# Patient Record
Sex: Male | Born: 1980 | Race: Black or African American | Hispanic: No | Marital: Married | Smoking: Never smoker
Health system: Southern US, Community
[De-identification: ages and names within clinical notes are randomized; demographics above are authoritative.]

## PROBLEM LIST (undated history)

## (undated) DIAGNOSIS — K219 Gastro-esophageal reflux disease without esophagitis: Secondary | ICD-10-CM

## (undated) DIAGNOSIS — M1990 Unspecified osteoarthritis, unspecified site: Secondary | ICD-10-CM

## (undated) DIAGNOSIS — I10 Essential (primary) hypertension: Secondary | ICD-10-CM

## (undated) HISTORY — PX: KNEE SURGERY: SHX244

---
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
## 2001-09-15 ENCOUNTER — Encounter: Payer: Self-pay | Admitting: Family Medicine

## 2001-09-15 ENCOUNTER — Ambulatory Visit (HOSPITAL_COMMUNITY): Admission: RE | Admit: 2001-09-15 | Discharge: 2001-09-15 | Payer: Self-pay | Admitting: Family Medicine

## 2002-08-21 ENCOUNTER — Emergency Department (HOSPITAL_COMMUNITY): Admission: EM | Admit: 2002-08-21 | Discharge: 2002-08-21 | Payer: Self-pay | Admitting: Emergency Medicine

## 2005-09-05 ENCOUNTER — Emergency Department (HOSPITAL_COMMUNITY): Admission: EM | Admit: 2005-09-05 | Discharge: 2005-09-05 | Payer: Self-pay | Admitting: Emergency Medicine

## 2012-08-11 ENCOUNTER — Other Ambulatory Visit (HOSPITAL_COMMUNITY): Payer: Self-pay | Admitting: Family Medicine

## 2012-08-11 ENCOUNTER — Ambulatory Visit (HOSPITAL_COMMUNITY)
Admission: RE | Admit: 2012-08-11 | Discharge: 2012-08-11 | Disposition: A | Discharge status: Home / Self Care | Payer: 59 | Source: Ambulatory Visit | Attending: Family Medicine | Admitting: Family Medicine

## 2012-08-11 DIAGNOSIS — R05 Cough: Secondary | ICD-10-CM

## 2012-08-11 DIAGNOSIS — R059 Cough, unspecified: Secondary | ICD-10-CM

## 2016-03-25 ENCOUNTER — Encounter (HOSPITAL_COMMUNITY): Payer: Self-pay | Admitting: *Deleted

## 2016-03-25 ENCOUNTER — Emergency Department (HOSPITAL_COMMUNITY): Payer: BLUE CROSS/BLUE SHIELD

## 2016-03-25 ENCOUNTER — Emergency Department (HOSPITAL_COMMUNITY)
Admission: EM | Admit: 2016-03-25 | Discharge: 2016-03-25 | Disposition: A | Discharge status: Home / Self Care | Payer: BLUE CROSS/BLUE SHIELD | Attending: Emergency Medicine | Admitting: Emergency Medicine

## 2016-03-25 DIAGNOSIS — Y929 Unspecified place or not applicable: Secondary | ICD-10-CM | POA: Insufficient documentation

## 2016-03-25 DIAGNOSIS — Y999 Unspecified external cause status: Secondary | ICD-10-CM | POA: Insufficient documentation

## 2016-03-25 DIAGNOSIS — S93402A Sprain of unspecified ligament of left ankle, initial encounter: Secondary | ICD-10-CM | POA: Diagnosis not present

## 2016-03-25 DIAGNOSIS — Y939 Activity, unspecified: Secondary | ICD-10-CM | POA: Insufficient documentation

## 2016-03-25 DIAGNOSIS — S99912A Unspecified injury of left ankle, initial encounter: Secondary | ICD-10-CM | POA: Diagnosis present

## 2016-03-25 DIAGNOSIS — W010XXA Fall on same level from slipping, tripping and stumbling without subsequent striking against object, initial encounter: Secondary | ICD-10-CM | POA: Insufficient documentation

## 2016-03-25 MED ORDER — IBUPROFEN 400 MG PO TABS
400.0000 mg | ORAL_TABLET | Freq: Once | ORAL | Status: AC
  Administered 2016-03-25: 400 mg via ORAL
  Filled 2016-03-25: qty 1

## 2016-03-25 NOTE — ED Triage Notes (Signed)
Pt with left ankle pain since he tripped over a rock today.  Pt was able to ambulate from waiting room to triage.  Denies taking anything for it
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
## 2016-03-25 NOTE — Discharge Instructions (Signed)
Get help right away if: °Your toes or foot becomes numb or blue. °You have severe pain that gets worse °

## 2017-02-15 ENCOUNTER — Emergency Department
Admission: EM | Admit: 2017-02-15 | Discharge: 2017-02-15 | Disposition: A | Discharge status: Home / Self Care | Payer: BLUE CROSS/BLUE SHIELD | Attending: Emergency Medicine | Admitting: Emergency Medicine

## 2017-02-15 ENCOUNTER — Encounter: Payer: Self-pay | Admitting: Emergency Medicine

## 2017-02-15 ENCOUNTER — Emergency Department: Payer: BLUE CROSS/BLUE SHIELD

## 2017-02-15 DIAGNOSIS — S161XXA Strain of muscle, fascia and tendon at neck level, initial encounter: Secondary | ICD-10-CM | POA: Insufficient documentation

## 2017-02-15 DIAGNOSIS — Y999 Unspecified external cause status: Secondary | ICD-10-CM | POA: Insufficient documentation

## 2017-02-15 DIAGNOSIS — R40241 Glasgow coma scale score 13-15, unspecified time: Secondary | ICD-10-CM | POA: Diagnosis not present

## 2017-02-15 DIAGNOSIS — Y92410 Unspecified street and highway as the place of occurrence of the external cause: Secondary | ICD-10-CM | POA: Insufficient documentation

## 2017-02-15 DIAGNOSIS — W2211XA Striking against or struck by driver side automobile airbag, initial encounter: Secondary | ICD-10-CM | POA: Diagnosis not present

## 2017-02-15 DIAGNOSIS — S199XXA Unspecified injury of neck, initial encounter: Secondary | ICD-10-CM | POA: Diagnosis present

## 2017-02-15 DIAGNOSIS — Y9389 Activity, other specified: Secondary | ICD-10-CM | POA: Insufficient documentation

## 2017-02-15 DIAGNOSIS — S39012A Strain of muscle, fascia and tendon of lower back, initial encounter: Secondary | ICD-10-CM | POA: Diagnosis not present

## 2017-02-15 MED ORDER — ACETAMINOPHEN 500 MG PO TABS
1000.0000 mg | ORAL_TABLET | Freq: Once | ORAL | Status: AC
  Administered 2017-02-15: 1000 mg via ORAL
  Filled 2017-02-15: qty 2

## 2017-02-15 MED ORDER — OXYCODONE HCL 5 MG PO TABS
5.0000 mg | ORAL_TABLET | Freq: Once | ORAL | Status: AC
  Administered 2017-02-15: 5 mg via ORAL
  Filled 2017-02-15: qty 1

## 2017-02-15 MED ORDER — IBUPROFEN 800 MG PO TABS: 800.0000 mg | ORAL_TABLET | Freq: Three times a day (TID) | ORAL | 0 refills | Status: DC | PRN

## 2017-02-15 MED ORDER — CYCLOBENZAPRINE HCL 10 MG PO TABS: 10.0000 mg | ORAL_TABLET | Freq: Three times a day (TID) | ORAL | 0 refills | Status: DC | PRN
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
## 2017-02-15 NOTE — ED Notes (Signed)
Pt off floor for CT and xray

## 2017-02-15 NOTE — ED Notes (Signed)
ED Provider at bedside. 
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
## 2017-03-02 ENCOUNTER — Emergency Department
Admission: EM | Admit: 2017-03-02 | Discharge: 2017-03-02 | Disposition: A | Discharge status: Home / Self Care | Payer: BLUE CROSS/BLUE SHIELD | Attending: Emergency Medicine | Admitting: Emergency Medicine

## 2017-03-02 DIAGNOSIS — Z79899 Other long term (current) drug therapy: Secondary | ICD-10-CM | POA: Diagnosis not present

## 2017-03-02 DIAGNOSIS — M545 Low back pain: Secondary | ICD-10-CM | POA: Diagnosis not present

## 2017-03-02 DIAGNOSIS — R51 Headache: Secondary | ICD-10-CM | POA: Diagnosis not present

## 2017-03-02 MED ORDER — ORPHENADRINE CITRATE ER 100 MG PO TB12: 100.0000 mg | ORAL_TABLET | Freq: Two times a day (BID) | ORAL | 1 refills | Status: AC

## 2017-03-02 MED ORDER — ORPHENADRINE CITRATE 30 MG/ML IJ SOLN
60.0000 mg | Freq: Two times a day (BID) | INTRAMUSCULAR | Status: DC
  Administered 2017-03-02: 60 mg via INTRAMUSCULAR
  Filled 2017-03-02: qty 2

## 2017-03-02 MED ORDER — KETOROLAC TROMETHAMINE 10 MG PO TABS: 10.0000 mg | ORAL_TABLET | Freq: Four times a day (QID) | ORAL | 0 refills | Status: AC | PRN

## 2017-03-02 MED ORDER — KETOROLAC TROMETHAMINE 30 MG/ML IJ SOLN
30.0000 mg | Freq: Once | INTRAMUSCULAR | Status: AC
  Administered 2017-03-02: 30 mg via INTRAMUSCULAR
  Filled 2017-03-02: qty 1
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
## 2017-04-14 ENCOUNTER — Other Ambulatory Visit (HOSPITAL_COMMUNITY): Payer: Self-pay | Admitting: Family Medicine

## 2017-04-14 DIAGNOSIS — R1084 Generalized abdominal pain: Secondary | ICD-10-CM

## 2017-04-14 DIAGNOSIS — R10827 Generalized rebound abdominal tenderness: Secondary | ICD-10-CM

## 2017-04-27 ENCOUNTER — Ambulatory Visit (HOSPITAL_COMMUNITY)
Admission: RE | Admit: 2017-04-27 | Discharge: 2017-04-27 | Disposition: A | Discharge status: Home / Self Care | Payer: BLUE CROSS/BLUE SHIELD | Source: Ambulatory Visit | Attending: Family Medicine | Admitting: Family Medicine

## 2017-04-27 DIAGNOSIS — R1084 Generalized abdominal pain: Secondary | ICD-10-CM | POA: Diagnosis present

## 2017-04-27 DIAGNOSIS — N281 Cyst of kidney, acquired: Secondary | ICD-10-CM | POA: Diagnosis not present

## 2017-04-27 DIAGNOSIS — R10827 Generalized rebound abdominal tenderness: Secondary | ICD-10-CM

## 2017-04-27 MED ORDER — IOPAMIDOL (ISOVUE-300) INJECTION 61%
100.0000 mL | Freq: Once | INTRAVENOUS | Status: AC | PRN
  Administered 2017-04-27: 100 mL via INTRAVENOUS

## 2017-05-09 ENCOUNTER — Other Ambulatory Visit: Payer: Self-pay | Admitting: Student

## 2017-05-09 DIAGNOSIS — M79601 Pain in right arm: Secondary | ICD-10-CM

## 2017-05-09 DIAGNOSIS — M79602 Pain in left arm: Principal | ICD-10-CM

## 2017-05-13 ENCOUNTER — Ambulatory Visit
Admission: RE | Admit: 2017-05-13 | Discharge: 2017-05-13 | Disposition: A | Discharge status: Home / Self Care | Payer: BLUE CROSS/BLUE SHIELD | Source: Ambulatory Visit | Attending: Student | Admitting: Student

## 2017-05-13 DIAGNOSIS — M79601 Pain in right arm: Secondary | ICD-10-CM | POA: Insufficient documentation

## 2017-05-13 DIAGNOSIS — M79602 Pain in left arm: Secondary | ICD-10-CM | POA: Diagnosis present

## 2017-05-16 ENCOUNTER — Encounter: Payer: Self-pay | Admitting: Internal Medicine

## 2017-05-25 ENCOUNTER — Other Ambulatory Visit: Payer: Self-pay | Admitting: Gastroenterology

## 2017-05-25 ENCOUNTER — Ambulatory Visit (INDEPENDENT_AMBULATORY_CARE_PROVIDER_SITE_OTHER): Payer: BLUE CROSS/BLUE SHIELD | Admitting: Gastroenterology

## 2017-05-25 ENCOUNTER — Encounter: Payer: Self-pay | Admitting: Gastroenterology

## 2017-05-25 ENCOUNTER — Telehealth: Payer: Self-pay

## 2017-05-25 ENCOUNTER — Other Ambulatory Visit: Payer: Self-pay

## 2017-05-25 DIAGNOSIS — R1084 Generalized abdominal pain: Principal | ICD-10-CM

## 2017-05-25 DIAGNOSIS — G8929 Other chronic pain: Secondary | ICD-10-CM

## 2017-05-25 MED ORDER — LINACLOTIDE 72 MCG PO CAPS
72.0000 ug | ORAL_CAPSULE | Freq: Every day | ORAL | 11 refills | Status: DC
Start: 2017-05-25 — End: 2017-05-31

## 2017-05-25 MED ORDER — LIDOCAINE VISCOUS 2 % MT SOLN: OROMUCOSAL | 5 refills | Status: DC
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
## 2017-05-25 NOTE — Assessment & Plan Note (Addendum)
ASSOCIATED WITH WORSENING CONSTIPATION AFTER TAKING QID TORSEMIDE. NO WARNING SIGNS/SYMPTOMS. CT SCAN NEGATIVE FOR ACUTE DISEASE.  FOLLOW A LOW FODMAP DIET.  HANDOUT GIVEN. TO REDUCE ABDOMINAL PAIN, USE VISCOUS LIDOCAINE 2 TSP EVERY 4 TO 6 HOURS FOR ABDOMINAL PAIN OR HEARTBURN. USE NO MORE THAN 8 DOSES A DAY. IT WILL MAKE HER MOUTH, ESOPHAGUS, AND STOMACH NUMB. TO PREVENT CONSTIPATION, ADD LINZESS 30 MINS PRIOR TO BREAKFAST. YOU SHOULD HAVE A BM 1-3 HRS AFTER THE DOSE IF NOT TAKE THE LINZESS WITH BREAKFAST. IT CAN CAUSE EXPLOSIVE DIARRHEA. IF IT CAUSES EXPLOSIVE WATERY DIARRHEA, OPEN LINZESS CAPSULE. PLACE IN 4 TSP WATER. STIR IT FOR ONE MINUTE AND TAKE 2 TSP LIQUID. COMPLETE LABS AND UPPER ENDOSCOPY W/ MAC. PT REPORTS HE IS HARD TO SEDATE. DISCUSSED PROCEDURE, BENEFITS, & RISKS: < 1% chance of medication reaction, bleeding, OR perforation. IF NO SOURCE FOR ABDOMINAL PAIN FOUND, CONSIDER GIVENS CAPSULE ENDOSCOPY.  FOLLOW UP IN 4 MOS.

## 2017-05-25 NOTE — Patient Instructions (Signed)
Called [employer] to see if pt needed PA for EGD. No PA needed. Ref# [id number].

## 2017-05-25 NOTE — Progress Notes (Signed)
cc'ed to pcp °
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
## 2017-05-25 NOTE — Progress Notes (Signed)
On recall  °

## 2017-05-25 NOTE — Telephone Encounter (Signed)
Called pt to inform of pre-op appt 05/30/17 at 2:15pm. He wants a different appt. Gave him phone number to Endo Scheduler to change his appt.

## 2017-05-25 NOTE — Patient Instructions (Signed)
FOLLOW A LOW FODMAP DIET. SEE HANDOUT.  TO REDUCE ABDOMINAL PAIN, USE VISCOUS LIDOCAINE 2 TSP EVERY 4 TO 6 HOURS FOR ABDOMINAL PAIN OR HEARTBURN. USE NO MORE THAN 8 DOSES A DAY. IT WILL MAKE HER MOUTH, ESOPHAGUS, AND STOMACH NUMB.   TO PREVENT CONSTIPATION, ADD LINZESS 30 MINS PRIOR TO BREAKFAST. YOU SHOULD HAVE A BM 1-3 HRS AFTER THE DOSE IF NOT TAKE THE LINZESS WITH BREAKFAST. IT CAN CAUSE EXPLOSIVE DIARRHEA. IF IT CAUSES EXPLOSIVE WATERY DIARRHEA, OPEN LINZESS CAPSULE. PLACE IN 4 TSP WATER. STIR IT FOR ONE MINUTE AND TAKE 2 TSP LIQUID.  COMPLETE LABS AND UPPER ENDOSCOPY.  FOLLOW UP IN 4 MOS.

## 2017-05-26 LAB — COMPREHENSIVE METABOLIC PANEL
ALBUMIN: 4.6 g/dL (ref 3.5–5.5)
ALT: 26 IU/L (ref 0–44)
AST: 18 IU/L (ref 0–40)
Albumin/Globulin Ratio: 1.5 (ref 1.2–2.2)
Alkaline Phosphatase: 56 IU/L (ref 39–117)
BUN / CREAT RATIO: 11 (ref 9–20)
BUN: 12 mg/dL (ref 6–20)
Bilirubin Total: 0.3 mg/dL (ref 0.0–1.2)
CHLORIDE: 100 mmol/L (ref 96–106)
CO2: 22 mmol/L (ref 20–29)
CREATININE: 1.14 mg/dL (ref 0.76–1.27)
Calcium: 9.7 mg/dL (ref 8.7–10.2)
GFR calc non Af Amer: 83 mL/min/{1.73_m2} (ref >59)
GFR, EST AFRICAN AMERICAN: 96 mL/min/{1.73_m2} (ref >59)
GLUCOSE: 89 mg/dL (ref 65–99)
Globulin, Total: 3 g/dL (ref 1.5–4.5)
Potassium: 4.6 mmol/L (ref 3.5–5.2)
Sodium: 141 mmol/L (ref 134–144)
TOTAL PROTEIN: 7.6 g/dL (ref 6.0–8.5)

## 2017-05-26 LAB — CBC/DIFF AMBIGUOUS DEFAULT
BASOS ABS: 0 10*3/uL (ref 0.0–0.2)
Basos: 0 %
EOS (ABSOLUTE): 0.2 10*3/uL (ref 0.0–0.4)
Eos: 2 %
HEMOGLOBIN: 14.2 g/dL (ref 13.0–17.7)
Hematocrit: 42.8 % (ref 37.5–51.0)
IMMATURE GRANS (ABS): 0 10*3/uL (ref 0.0–0.1)
IMMATURE GRANULOCYTES: 1 %
LYMPHS: 29 %
Lymphocytes Absolute: 2.4 10*3/uL (ref 0.7–3.1)
MCH: 29.3 pg (ref 26.6–33.0)
MCHC: 33.2 g/dL (ref 31.5–35.7)
MCV: 88 fL (ref 79–97)
MONOCYTES: 11 %
Monocytes Absolute: 0.9 10*3/uL (ref 0.1–0.9)
NEUTROS PCT: 57 %
Neutrophils Absolute: 4.8 10*3/uL (ref 1.4–7.0)
PLATELETS: 300 10*3/uL (ref 150–379)
RBC: 4.85 x10E6/uL (ref 4.14–5.80)
RDW: 13.7 % (ref 12.3–15.4)
WBC: 8.2 10*3/uL (ref 3.4–10.8)

## 2017-05-26 LAB — LIPASE: LIPASE: 30 U/L (ref 13–78)

## 2017-05-26 LAB — SPECIMEN STATUS REPORT

## 2017-05-26 NOTE — Progress Notes (Signed)
Pt is aware.  

## 2017-05-27 NOTE — Patient Instructions (Signed)
Almira CoasterDeshanda Maryann ConnersM Huettner  05/27/2017     @PREFPERIOPPHARMACY @   Your procedure is scheduled on  05/31/2017 .  [facility]  [facility].  Call this number if you have problems the morning of surgery:  [phone]   Remember:  Do not eat food or drink liquids after midnight.  Take these medicines the morning of surgery with A SIP OF WATER  Neurontin, lisinopril.   Do not wear jewelry, make-up or nail polish.  Do not wear lotions, powders, or perfumes, or deodorant.  Do not shave 48 hours prior to surgery.  Men may shave face and neck.  Do not bring valuables to the hospital.  [facility] any belongings or valuables.  Contacts, dentures or bridgework may not be worn into surgery.  Leave your suitcase in the car.  After surgery it may be brought to your room.  For patients admitted to the hospital, discharge time will be determined by your treatment team.  Patients discharged the day of surgery will not be allowed to drive home.   Name and phone number of your driver:   family Special instructions:  Follow the diet instructions given to you by Dr Evelina DunField's office.  Please read over the following fact sheets that you were given. Anesthesia Post-op Instructions and Care and Recovery After Surgery       Esophagogastroduodenoscopy Esophagogastroduodenoscopy (EGD) is a procedure to examine the lining of the esophagus, stomach, and first part of the small intestine (duodenum). This procedure is done to check for problems such as inflammation, bleeding, ulcers, or growths. During this procedure, a long, flexible, lighted tube with a camera attached (endoscope) is inserted down the throat. Tell a health care provider about:  Any allergies you have.  All medicines you are taking, including vitamins, herbs, eye drops, creams, and over-the-counter medicines.  Any problems you or family members have had with anesthetic medicines.  Any blood  disorders you have.  Any surgeries you have had.  Any medical conditions you have.  Whether you are pregnant or may be pregnant. What are the risks? Generally, this is a safe procedure. However, problems may occur, including:  Infection.  Bleeding.  A tear (perforation) in the esophagus, stomach, or duodenum.  Trouble breathing.  Excessive sweating.  Spasms of the larynx.  A slowed heartbeat.  Low blood pressure.  What happens before the procedure?  Follow instructions from your health care provider about eating or drinking restrictions.  Ask your health care provider about: ? Changing or stopping your regular medicines. This is especially important if you are taking diabetes medicines or blood thinners. ? Taking medicines such as aspirin and ibuprofen. These medicines can thin your blood. Do not take these medicines before your procedure if your health care provider instructs you not to.  Plan to have someone take you home after the procedure.  If you wear dentures, be ready to remove them before the procedure. What happens during the procedure?  To reduce your risk of infection, your health care team will wash or sanitize their hands.  An IV tube will be put in a vein in your hand or arm. You will get medicines and fluids through this tube.  You will be given one or more of the following: ? A medicine to help you relax (sedative). ? A medicine to numb the area (local anesthetic). This medicine may be sprayed into your  throat. It will make you feel more comfortable and keep you from gagging or coughing during the procedure. ? A medicine for pain.  A mouth guard may be placed in your mouth to protect your teeth and to keep you from biting on the endoscope.  You will be asked to lie on your left side.  The endoscope will be lowered down your throat into your esophagus, stomach, and duodenum.  Air will be put into the endoscope. This will help your health care  provider see better.  The lining of your esophagus, stomach, and duodenum will be examined.  Your health care provider may: ? Take a tissue sample so it can be looked at in a lab (biopsy). ? Remove growths. ? Remove objects (foreign bodies) that are stuck. ? Treat any bleeding with medicines or other devices that stop tissue from bleeding. ? Widen (dilate) or stretch narrowed areas of your esophagus and stomach.  The endoscope will be taken out. The procedure may vary among health care providers and hospitals. What happens after the procedure?  Your blood pressure, heart rate, breathing rate, and blood oxygen level will be monitored often until the medicines you were given have worn off.  Do not eat or drink anything until the numbing medicine has worn off and your gag reflex has returned. This information is not intended to replace advice given to you by your health care provider. Make sure you discuss any questions you have with your health care provider. Document Released: 08/20/2004 Document Revised: 09/25/2015 Document Reviewed: 03/13/2015 Elsevier Interactive Patient Education  2018 [employer]. Esophagogastroduodenoscopy, Care After Refer to this sheet in the next few weeks. These instructions provide you with information about caring for yourself after your procedure. Your health care provider may also give you more specific instructions. Your treatment has been planned according to current medical practices, but problems sometimes occur. Call your health care provider if you have any problems or questions after your procedure. What can I expect after the procedure? After the procedure, it is common to have:  A sore throat.  Nausea.  Bloating.  Dizziness.  Fatigue.  Follow these instructions at home:  Do not eat or drink anything until the numbing medicine (local anesthetic) has worn off and your gag reflex has returned. You will know that the local anesthetic has worn  off when you can swallow comfortably.  Do not drive for 24 hours if you received a medicine to help you relax (sedative).  If your health care provider took a tissue sample for testing during the procedure, make sure to get your test results. This is your responsibility. Ask your health care provider or the department performing the test when your results will be ready.  Keep all follow-up visits as told by your health care provider. This is important. Contact a health care provider if:  You cannot stop coughing.  You are not urinating.  You are urinating less than usual. Get help right away if:  You have trouble swallowing.  You cannot eat or drink.  You have throat or chest pain that gets worse.  You are dizzy or light-headed.  You faint.  You have nausea or vomiting.  You have chills.  You have a fever.  You have severe abdominal pain.  You have black, tarry, or bloody stools. This information is not intended to replace advice given to you by your health care provider. Make sure you discuss any questions you have with your health care provider.  Document Released: 04/05/2012 Document Revised: 09/25/2015 Document Reviewed: 03/13/2015 Elsevier Interactive Patient Education  2018 [place] Anesthesia is a term that refers to techniques, procedures, and medicines that help a person stay safe and comfortable during a medical procedure. Monitored anesthesia care, or sedation, is one type of anesthesia. Your anesthesia specialist may recommend sedation if you will be having a procedure that does not require you to be unconscious, such as:  Cataract surgery.  A dental procedure.  A biopsy.  A colonoscopy.  During the procedure, you may receive a medicine to help you relax (sedative). There are three levels of sedation:  Mild sedation. At this level, you may feel awake and relaxed. You will be able to follow directions.  Moderate  sedation. At this level, you will be sleepy. You may not remember the procedure.  Deep sedation. At this level, you will be asleep. You will not remember the procedure.  The more medicine you are given, the deeper your level of sedation will be. Depending on how you respond to the procedure, the anesthesia specialist may change your level of sedation or the type of anesthesia to fit your needs. An anesthesia specialist will monitor you closely during the procedure. Let your health care provider know about:  Any allergies you have.  All medicines you are taking, including vitamins, herbs, eye drops, creams, and over-the-counter medicines.  Any use of steroids (by mouth or as a cream).  Any problems you or family members have had with sedatives and anesthetic medicines.  Any blood disorders you have.  Any surgeries you have had.  Any medical conditions you have, such as sleep apnea.  Whether you are pregnant or may be pregnant.  Any use of cigarettes, alcohol, or street drugs. What are the risks? Generally, this is a safe procedure. However, problems may occur, including:  Getting too much medicine (oversedation).  Nausea.  Allergic reaction to medicines.  Trouble breathing. If this happens, a breathing tube may be used to help with breathing. It will be removed when you are awake and breathing on your own.  Heart trouble.  Lung trouble.  Before the procedure Staying hydrated Follow instructions from your health care provider about hydration, which may include:  Up to 2 hours before the procedure - you may continue to drink clear liquids, such as water, clear fruit juice, black coffee, and plain tea.  Eating and drinking restrictions Follow instructions from your health care provider about eating and drinking, which may include:  8 hours before the procedure - stop eating heavy meals or foods such as meat, fried foods, or fatty foods.  6 hours before the procedure -  stop eating light meals or foods, such as toast or cereal.  6 hours before the procedure - stop drinking milk or drinks that contain milk.  2 hours before the procedure - stop drinking clear liquids.  Medicines Ask your health care provider about:  Changing or stopping your regular medicines. This is especially important if you are taking diabetes medicines or blood thinners.  Taking medicines such as aspirin and ibuprofen. These medicines can thin your blood. Do not take these medicines before your procedure if your health care provider instructs you not to.  Tests and exams  You will have a physical exam.  You may have blood tests done to show: ? How well your kidneys and liver are working. ? How well your blood can clot.  General instructions  Plan to have  someone take you home from the hospital or clinic.  If you will be going home right after the procedure, plan to have someone with you for 24 hours.  What happens during the procedure?  Your blood pressure, heart rate, breathing, level of pain and overall condition will be monitored.  An IV tube will be inserted into one of your veins.  Your anesthesia specialist will give you medicines as needed to keep you comfortable during the procedure. This may mean changing the level of sedation.  The procedure will be performed. After the procedure  Your blood pressure, heart rate, breathing rate, and blood oxygen level will be monitored until the medicines you were given have worn off.  Do not drive for 24 hours if you received a sedative.  You may: ? Feel sleepy, clumsy, or nauseous. ? Feel forgetful about what happened after the procedure. ? Have a sore throat if you had a breathing tube during the procedure. ? Vomit. This information is not intended to replace advice given to you by your health care provider. Make sure you discuss any questions you have with your health care provider. Document Released: 01/13/2005  Document Revised: 09/26/2015 Document Reviewed: 08/10/2015 Elsevier Interactive Patient Education  2018 [place], Care After These instructions provide you with information about caring for yourself after your procedure. Your health care provider may also give you more specific instructions. Your treatment has been planned according to current medical practices, but problems sometimes occur. Call your health care provider if you have any problems or questions after your procedure. What can I expect after the procedure? After your procedure, it is common to:  Feel sleepy for several hours.  Feel clumsy and have poor balance for several hours.  Feel forgetful about what happened after the procedure.  Have poor judgment for several hours.  Feel nauseous or vomit.  Have a sore throat if you had a breathing tube during the procedure.  Follow these instructions at home: For at least 24 hours after the procedure:   Do not: ? Participate in activities in which you could fall or become injured. ? Drive. ? Use heavy machinery. ? Drink alcohol. ? Take sleeping pills or medicines that cause drowsiness. ? Make important decisions or sign legal documents. ? Take care of children on your own.  Rest. Eating and drinking  Follow the diet that is recommended by your health care provider.  If you vomit, drink water, juice, or soup when you can drink without vomiting.  Make sure you have little or no nausea before eating solid foods. General instructions  Have a responsible adult stay with you until you are awake and alert.  Take over-the-counter and prescription medicines only as told by your health care provider.  If you smoke, do not smoke without supervision.  Keep all follow-up visits as told by your health care provider. This is important. Contact a health care provider if:  You keep feeling nauseous or you keep vomiting.  You feel  light-headed.  You develop a rash.  You have a fever. Get help right away if:  You have trouble breathing. This information is not intended to replace advice given to you by your health care provider. Make sure you discuss any questions you have with your health care provider. Document Released: 08/10/2015 Document Revised: 12/10/2015 Document Reviewed: 08/10/2015 Elsevier Interactive Patient Education  [employer].

## 2017-05-30 ENCOUNTER — Encounter (HOSPITAL_COMMUNITY)
Admission: RE | Admit: 2017-05-30 | Discharge: 2017-05-30 | Disposition: A | Discharge status: Home / Self Care | Payer: BLUE CROSS/BLUE SHIELD | Source: Ambulatory Visit | Attending: Gastroenterology | Admitting: Gastroenterology

## 2017-05-30 ENCOUNTER — Encounter (HOSPITAL_COMMUNITY): Payer: Self-pay

## 2017-05-30 ENCOUNTER — Other Ambulatory Visit: Payer: Self-pay

## 2017-05-31 ENCOUNTER — Telehealth: Payer: Self-pay

## 2017-05-31 ENCOUNTER — Ambulatory Visit (HOSPITAL_COMMUNITY)
Admission: RE | Admit: 2017-05-31 | Discharge: 2017-05-31 | Disposition: A | Discharge status: Home / Self Care | Payer: BLUE CROSS/BLUE SHIELD | Source: Ambulatory Visit | Attending: Gastroenterology | Admitting: Gastroenterology

## 2017-05-31 ENCOUNTER — Ambulatory Visit (HOSPITAL_COMMUNITY): Payer: BLUE CROSS/BLUE SHIELD | Admitting: Anesthesiology

## 2017-05-31 ENCOUNTER — Encounter (HOSPITAL_COMMUNITY): Payer: Self-pay | Admitting: *Deleted

## 2017-05-31 ENCOUNTER — Encounter (HOSPITAL_COMMUNITY)
Admission: RE | Disposition: A | Discharge status: Home / Self Care | Payer: Self-pay | Source: Ambulatory Visit | Attending: Gastroenterology

## 2017-05-31 DIAGNOSIS — G8929 Other chronic pain: Secondary | ICD-10-CM

## 2017-05-31 DIAGNOSIS — R1013 Epigastric pain: Secondary | ICD-10-CM | POA: Diagnosis not present

## 2017-05-31 DIAGNOSIS — K2980 Duodenitis without bleeding: Secondary | ICD-10-CM | POA: Insufficient documentation

## 2017-05-31 DIAGNOSIS — I252 Old myocardial infarction: Secondary | ICD-10-CM | POA: Insufficient documentation

## 2017-05-31 DIAGNOSIS — R1084 Generalized abdominal pain: Secondary | ICD-10-CM

## 2017-05-31 DIAGNOSIS — K2970 Gastritis, unspecified, without bleeding: Secondary | ICD-10-CM

## 2017-05-31 DIAGNOSIS — Z79899 Other long term (current) drug therapy: Secondary | ICD-10-CM | POA: Insufficient documentation

## 2017-05-31 DIAGNOSIS — K3189 Other diseases of stomach and duodenum: Secondary | ICD-10-CM | POA: Diagnosis not present

## 2017-05-31 DIAGNOSIS — K2950 Unspecified chronic gastritis without bleeding: Secondary | ICD-10-CM | POA: Diagnosis not present

## 2017-05-31 HISTORY — PX: ESOPHAGOGASTRODUODENOSCOPY (EGD) WITH PROPOFOL: SHX5813

## 2017-05-31 SURGERY — ESOPHAGOGASTRODUODENOSCOPY (EGD) WITH PROPOFOL
Anesthesia: Monitor Anesthesia Care

## 2017-05-31 MED ORDER — PROPOFOL 10 MG/ML IV BOLUS
INTRAVENOUS | Status: AC
  Filled 2017-05-31: qty 40

## 2017-05-31 MED ORDER — LACTATED RINGERS IV SOLN
INTRAVENOUS | Status: DC
  Administered 2017-05-31: 09:00:00 via INTRAVENOUS

## 2017-05-31 MED ORDER — CHLORHEXIDINE GLUCONATE CLOTH 2 % EX PADS: 6.0000 | MEDICATED_PAD | Freq: Once | CUTANEOUS | Status: DC

## 2017-05-31 MED ORDER — LIDOCAINE VISCOUS 2 % MT SOLN
OROMUCOSAL | Status: AC
  Filled 2017-05-31: qty 15

## 2017-05-31 MED ORDER — PROPOFOL 500 MG/50ML IV EMUL
INTRAVENOUS | Status: DC | PRN
  Administered 2017-05-31: 135 ug/kg/min via INTRAVENOUS

## 2017-05-31 MED ORDER — ONDANSETRON 4 MG PO TBDP
ORAL_TABLET | ORAL | Status: AC
  Filled 2017-05-31: qty 1

## 2017-05-31 MED ORDER — MIDAZOLAM HCL 2 MG/2ML IJ SOLN
INTRAMUSCULAR | Status: AC
  Filled 2017-05-31: qty 2

## 2017-05-31 MED ORDER — ONDANSETRON 4 MG PO TBDP
4.0000 mg | ORAL_TABLET | Freq: Once | ORAL | Status: AC
  Administered 2017-05-31: 4 mg via ORAL

## 2017-05-31 MED ORDER — LIDOCAINE VISCOUS 2 % MT SOLN
5.0000 mL | Freq: Once | OROMUCOSAL | Status: AC
  Administered 2017-05-31: 5 mL via OROMUCOSAL

## 2017-05-31 MED ORDER — LUBIPROSTONE 24 MCG PO CAPS: 24.0000 ug | ORAL_CAPSULE | Freq: Two times a day (BID) | ORAL | 11 refills | Status: DC

## 2017-05-31 MED ORDER — MIDAZOLAM HCL 2 MG/2ML IJ SOLN
1.0000 mg | Freq: Once | INTRAMUSCULAR | Status: AC | PRN
  Administered 2017-05-31: 2 mg via INTRAVENOUS

## 2017-05-31 MED ORDER — MIDAZOLAM HCL 5 MG/5ML IJ SOLN
INTRAMUSCULAR | Status: DC | PRN
  Administered 2017-05-31: 2 mg via INTRAVENOUS

## 2017-05-31 NOTE — Interval H&P Note (Signed)
History and Physical Interval Note:  05/31/2017 9:20 AM  Jeffrey Thomas  has presented today for surgery, with the diagnosis of abdominal pain  The various methods of treatment have been discussed with the patient and family. After consideration of risks, benefits and other options for treatment, the patient has consented to  Procedure(s) with comments: ESOPHAGOGASTRODUODENOSCOPY (EGD) WITH PROPOFOL (N/A) - 9:45am as a surgical intervention .  The patient's history has been reviewed, patient examined, no change in status, stable for surgery.  I have reviewed the patient's chart and labs.  Questions were answered to the patient's satisfaction.     Eaton CorporationSandi Keah Lamba

## 2017-05-31 NOTE — Telephone Encounter (Signed)
REVIEWED-NO ADDITIONAL RECOMMENDATIONS. 

## 2017-05-31 NOTE — Op Note (Signed)
[facility] Patient Name: Jeffrey RidgesDeshanda Towles Procedure Date: 05/31/2017 9:09 AM MRN: [id number] Date of Birth: 09/24/1980 Attending MD: Jonette EvaSandi Rally Ouch MD, MD CSN: [id number] Age: 37 Admit Type: Outpatient Procedure:                Upper GI endoscopy WITH COLD FORCEPS BIOPSY Indications:              Dyspepsia Providers:                Jonette EvaSandi Muslima Toppins MD, MD, Loma MessingLurae B. Patsy LagerAlbert RN, RN, Edythe ClarityKelly                            Cox, Technician Referring MD:              Medicines:                Propofol per Anesthesia Complications:            No immediate complications. Estimated Blood Loss:     Estimated blood loss was minimal. Procedure:                Pre-Anesthesia Assessment:                           - Prior to the procedure, a History and Physical                            was performed, and patient medications and                            allergies were reviewed. The patient's tolerance of                            previous anesthesia was also reviewed. The risks                            and benefits of the procedure and the sedation                            options and risks were discussed with the patient.                            All questions were answered, and informed consent                            was obtained. Prior Anticoagulants: The patient has                            taken no previous anticoagulant or antiplatelet                            agents. ASA Grade Assessment: II - A patient with                            mild systemic disease. After reviewing the risks  and benefits, the patient was deemed in                            satisfactory condition to undergo the procedure.                            After obtaining informed consent, the endoscope was                            passed under direct vision. Throughout the                            procedure, the patient's blood pressure, pulse, and   oxygen saturations were monitored continuously. The                            [id number] ([id number]) scope was introduced through the                            mouth, and advanced to the second part of duodenum.                            The upper GI endoscopy was accomplished without                            difficulty. The patient tolerated the procedure                            well. Scope In: 9:44:32 AM Scope Out: 9:51:56 AM Total Procedure Duration: 0 hours 7 minutes 24 seconds  Findings:      Diffuse mildly erythematous mucosa without bleeding was found in the       gastric antrum. Biopsies were taken with a cold forceps for histology.      The examined duodenum was normal. Biopsies were taken with a cold       forceps for histology.      A low-grade of narrowing Schatzki ring (acquired) was found at the       gastroesophageal junction. Impression:               - PATENT SCHAZTKI'S RING                           - MILD GASTRITIS Biopsied.                           - NO SOURCE FOR EPIGASTRIC PAIN/DYSPEPSIA IDENTIFIED Moderate Sedation:      Per Anesthesia Care Recommendation:           - Low fat diet.                           - Continue present medications. DISCUSSED WITH                            PHARMACY: AMITIZA AND LINZESS CO-PAY >$300. USE  AMITIZA OR LINZESS FOR CONSTIPATION. USE VISCOUS                            LIDOCIANE IF NEEDED FOR EPIGASTRIC PAIN.                           - Await pathology results.                           - Return to my office in 4 months.                           - Patient has a contact number available for                            emergencies. The signs and symptoms of potential                            delayed complications were discussed with the                            patient. Return to normal activities tomorrow.                            Written discharge instructions were provided to the                             patient. Procedure Code(s):        --- Professional ---                           [phone], Esophagogastroduodenoscopy, flexible,                            transoral; with biopsy, single or multiple Diagnosis Code(s):        --- Professional ---                           K31.89, Other diseases of stomach and duodenum                           R10.13, Epigastric pain CPT copyright 2016 American Medical Association. All rights reserved. The codes documented in this report are preliminary and upon coder review may  be revised to meet current compliance requirements. Jonette Eva, MD Jonette Eva MD, MD 05/31/2017 10:03:14 AM This report has been signed electronically. Number of Addenda: 0

## 2017-05-31 NOTE — Anesthesia Postprocedure Evaluation (Signed)
Anesthesia Post Note  Patient: Jeffrey Thomas  Procedure(s) Performed: ESOPHAGOGASTRODUODENOSCOPY (EGD) WITH PROPOFOL (N/A )  Patient location during evaluation: PACU Anesthesia Type: MAC Level of consciousness: awake Pain management: pain level controlled Vital Signs Assessment: post-procedure vital signs reviewed and stable Respiratory status: spontaneous breathing, nonlabored ventilation and respiratory function stable Cardiovascular status: blood pressure returned to baseline Postop Assessment: no apparent nausea or vomiting Anesthetic complications: no     Last Vitals:  Vitals:   05/31/17 0850 05/31/17 0900  BP: 125/84 125/84  Pulse:    Resp: 19 17  Temp:    SpO2: 97% 96%    Last Pain:  Vitals:   05/31/17 0804  TempSrc: Oral                 Antonio Woodhams J

## 2017-05-31 NOTE — Discharge Instructions (Signed)
YOUR UPPER ABDOMINAL PAIN IS MOST LIKELY DUE TO MILD gastritis & CONSTIPATION DUE TO MEDICATIONS. I biopsied your stomach AND SMALL BOWEL.   DRINK WATER TO KEEP YOUR URINE LIGHT YELLOW.  WE DO NOT HAVE SAMPLES OF AMITIZA. WE DO HAVE A THE CO-PAY CARD FOR AMITIZA WHICH YOU CAN TAKE TO THE PHARMACY AND SEE HOW MUCH IT REDUCES YOUR CO-PAY. CALL NEXT WEEK TO SEE IF WE HAVE SAMPLES OF AMITIZA 24 MCG.  WE DO HAVE SAMPLES OF LINZESS 290 MCG DAILY. TAKE ONE DAILY WITH YOUR FIRST MEAL. PLEASE CALL WITH QUESTIONS OR CONCERNS.  STRICTLY FOLLOW HIGH FIBER DIET. AVOID ITEMS THAT CAUSE BLOATING & GAS.SEE INFO BELOW.  USE VISCOUS LIDOCAINE 2 TSP WHEN NEEDED FOR FLARES OF HEARTBURN, CHEST PAIN, OR UPPER ABDOMINAL PAIN  YOUR BIOPSY RESULTS WILL BE AVAILABLE IN MY CHART AFTER FEB 1 AND MY OFFICE WILL CONTACT YOU IN 10-14 DAYS WITH YOUR RESULTS.   FOLLOW UP IN 4 MOS.   UPPER ENDOSCOPY AFTER CARE Read the instructions outlined below and refer to this sheet in the next week. These discharge instructions provide you with general information on caring for yourself after you leave the hospital. While your treatment has been planned according to the most current medical practices available, unavoidable complications occasionally occur. If you have any problems or questions after discharge, call DR. Merly Hinkson, [phone].  ACTIVITY  You may resume your regular activity, but move at a slower pace for the next 24 hours.   Take frequent rest periods for the next 24 hours.   Walking will help get rid of the air and reduce the bloated feeling in your belly (abdomen).   No driving for 24 hours (because of the medicine (anesthesia) used during the test).   You may shower.   Do not sign any important legal documents or operate any machinery for 24 hours (because of the anesthesia used during the test).    NUTRITION  Drink plenty of fluids.   You may resume your normal diet as instructed by your doctor.    Begin with a light meal and progress to your normal diet. Heavy or fried foods are harder to digest and may make you feel sick to your stomach (nauseated).   Avoid alcoholic beverages for 24 hours or as instructed.    MEDICATIONS  You may resume your normal medications.   WHAT YOU CAN EXPECT TODAY  Some feelings of bloating in the abdomen.   Passage of more gas than usual.    IF YOU HAD A BIOPSY TAKEN DURING THE UPPER ENDOSCOPY:  Eat a soft diet IF YOU HAVE NAUSEA, BLOATING, ABDOMINAL PAIN, OR VOMITING.    FINDING OUT THE RESULTS OF YOUR TEST Not all test results are available during your visit. DR. Darrick Penna WILL CALL YOU WITHIN 14 DAYS OF YOUR PROCEDUE WITH YOUR RESULTS. Do not assume everything is normal if you have not heard from DR. Kassidee Narciso, CALL HER OFFICE AT [phone].  SEEK IMMEDIATE MEDICAL ATTENTION AND CALL THE OFFICE: [phone] IF:  You have more than a spotting of blood in your stool.   Your belly is swollen (abdominal distention).   You are nauseated or vomiting.   You have a temperature over 101F.   You have abdominal pain or discomfort that is severe or gets worse throughout the day.   Gastritis  Gastritis is an inflammation (the body's way of reacting to injury and/or infection) of the stomach. It is often caused by bacterial (germ) infections. It can  also be caused BY ASPIRIN, BC/GOODY POWDER'S, (IBUPROFEN) MOTRIN, OR ALEVE (NAPROXEN), chemicals (including alcohol), SPICY FOODS, and medications. This illness may be associated with generalized malaise (feeling tired, not well), UPPER ABDOMINAL STOMACH cramps, and fever. One common bacterial cause of gastritis is an organism known as H. Pylori. This can be treated with antibiotics.    High-Fiber Diet A high-fiber diet changes your normal diet to include more whole grains, legumes, fruits, and vegetables. Changes in the diet involve replacing refined carbohydrates with unrefined foods. The calorie  level of the diet is essentially unchanged. The Dietary Reference Intake (recommended amount) for adult males is 38 grams per day. For adult females, it is 25 grams per day. Pregnant and lactating women should consume 28 grams of fiber per day.Fiber is the intact part of a plant that is not broken down during digestion. Functional fiber is fiber that has been isolated from the plant to provide a beneficial effect in the body.  PURPOSE  Increase stool bulk.   Ease and regulate bowel movements.   Lower cholesterol.   REDUCE RISK OF COLON CANCER  INDICATIONS THAT YOU NEED MORE FIBER  Constipation and hemorrhoids.   Uncomplicated diverticulosis (intestine condition) and irritable bowel syndrome.   Weight management.   As a protective measure against hardening of the arteries (atherosclerosis), diabetes, and cancer.   GUIDELINES FOR INCREASING FIBER IN THE DIET  Start adding fiber to the diet slowly. A gradual increase of about 5 more grams (2 slices of whole-wheat bread, 2 servings of most fruits or vegetables, or 1 bowl of high-fiber cereal) per day is best. Too rapid an increase in fiber may result in constipation, flatulence, and bloating.   Drink enough water and fluids to keep your urine clear or pale yellow. Water, juice, or caffeine-free drinks are recommended. Not drinking enough fluid may cause constipation.   Eat a variety of high-fiber foods rather than one type of fiber.   Try to increase your intake of fiber through using high-fiber foods rather than fiber pills or supplements that contain small amounts of fiber.   The goal is to change the types of food eaten. Do not supplement your present diet with high-fiber foods, but replace foods in your present diet.   INCLUDE A VARIETY OF FIBER SOURCES  Replace refined and processed grains with whole grains, canned fruits with fresh fruits, and incorporate other fiber sources. White rice, white breads, and most bakery goods  contain little or no fiber.   Brown whole-grain rice, buckwheat oats, and many fruits and vegetables are all good sources of fiber. These include: broccoli, Brussels sprouts, cabbage, cauliflower, beets, sweet potatoes, white potatoes (skin on), carrots, tomatoes, eggplant, squash, berries, fresh fruits, and dried fruits.   Cereals appear to be the richest source of fiber. Cereal fiber is found in whole grains and bran. Bran is the fiber-rich outer coat of cereal grain, which is largely removed in refining. In whole-grain cereals, the bran remains. In breakfast cereals, the largest amount of fiber is found in those with "bran" in their names. The fiber content is sometimes indicated on the label.   You may need to include additional fruits and vegetables each day.   In baking, for 1 cup white flour, you may use the following substitutions:   1 cup whole-wheat flour minus 2 tablespoons.   1/2 cup white flour plus 1/2 cup whole-wheat flour.

## 2017-05-31 NOTE — Telephone Encounter (Signed)
Per Dr. Darrick PennaFields, Linzess 290 mcg # 8 samples at front to take one capsule daily. Also, the co pay card for Amitiza left for him.

## 2017-05-31 NOTE — Anesthesia Preprocedure Evaluation (Signed)
Anesthesia Evaluation  Patient identified by MRN, date of birth, ID band Patient awake    Airway Mallampati: I  TM Distance: >3 FB     Dental  (+) Teeth Intact   Pulmonary neg pulmonary ROS,    Pulmonary exam normal        Cardiovascular Exercise Tolerance: Poor + Past MI (no hx of heart problems per patient/ no CP/SOB)   Rhythm:Regular Rate:Normal  15-Feb-2017 09:08:12 [facility]-ED ROUTINE RECORD Sinus rhythm  Exercise limited from back pain   Neuro/Psych negative neurological ROS     GI/Hepatic negative GI ROS, Neg liver ROS,   Endo/Other  negative endocrine ROS  Renal/GU negative Renal ROS     Musculoskeletal   Abdominal Normal abdominal exam  (+)   Peds  Hematology negative hematology ROS (+)   Anesthesia Other Findings   Reproductive/Obstetrics                             Anesthesia Physical Anesthesia Plan  ASA: II  Anesthesia Plan: MAC   Post-op Pain Management:    Induction:   PONV Risk Score and Plan:   Airway Management Planned: Nasal Cannula  Additional Equipment:   Intra-op Plan:   Post-operative Plan:   Informed Consent: I have reviewed the patients History and Physical, chart, labs and discussed the procedure including the risks, benefits and alternatives for the proposed anesthesia with the patient or authorized representative who has indicated his/her understanding and acceptance.   Dental advisory given  Plan Discussed with: CRNA  Anesthesia Plan Comments:         Anesthesia Quick Evaluation

## 2017-05-31 NOTE — Transfer of Care (Signed)
Immediate Anesthesia Transfer of Care Note  Patient: Jeffrey Thomas  Procedure(s) Performed: ESOPHAGOGASTRODUODENOSCOPY (EGD) WITH PROPOFOL (N/A )  Patient Location: PACU  Anesthesia Type:MAC  Level of Consciousness: awake and patient cooperative  Airway & Oxygen Therapy: Patient Spontanous Breathing and Patient connected to face mask oxygen  Post-op Assessment: Report given to RN, Post -op Vital signs reviewed and stable and Patient moving all extremities  Post vital signs: Reviewed and stable  Last Vitals:  Vitals:   05/31/17 0850 05/31/17 0900  BP: 125/84 125/84  Pulse:    Resp: 19 17  Temp:    SpO2: 97% 96%    Last Pain:  Vitals:   05/31/17 0804  TempSrc: Oral         Complications: No apparent anesthesia complications

## 2017-06-01 ENCOUNTER — Encounter (HOSPITAL_COMMUNITY): Payer: Self-pay | Admitting: Gastroenterology

## 2017-06-02 NOTE — Progress Notes (Signed)
Pt is aware.  

## 2017-06-14 ENCOUNTER — Other Ambulatory Visit: Payer: Self-pay | Admitting: Student

## 2017-06-14 DIAGNOSIS — M545 Low back pain: Secondary | ICD-10-CM

## 2017-06-23 ENCOUNTER — Ambulatory Visit
Admission: RE | Admit: 2017-06-23 | Discharge: 2017-06-23 | Disposition: A | Discharge status: Home / Self Care | Payer: BLUE CROSS/BLUE SHIELD | Source: Ambulatory Visit | Attending: Student | Admitting: Student

## 2017-06-23 DIAGNOSIS — M545 Low back pain: Secondary | ICD-10-CM

## 2017-06-23 DIAGNOSIS — M5137 Other intervertebral disc degeneration, lumbosacral region: Secondary | ICD-10-CM | POA: Insufficient documentation

## 2017-06-23 DIAGNOSIS — M5127 Other intervertebral disc displacement, lumbosacral region: Secondary | ICD-10-CM | POA: Diagnosis not present

## 2017-06-30 ENCOUNTER — Ambulatory Visit: Payer: BLUE CROSS/BLUE SHIELD | Admitting: Nurse Practitioner

## 2017-07-01 ENCOUNTER — Other Ambulatory Visit: Payer: Self-pay | Admitting: Physical Medicine and Rehabilitation

## 2017-07-01 DIAGNOSIS — M5416 Radiculopathy, lumbar region: Secondary | ICD-10-CM

## 2017-07-08 ENCOUNTER — Other Ambulatory Visit: Payer: Self-pay | Admitting: Physical Medicine and Rehabilitation

## 2017-07-08 ENCOUNTER — Ambulatory Visit
Admission: RE | Admit: 2017-07-08 | Discharge: 2017-07-08 | Disposition: A | Discharge status: Home / Self Care | Payer: BLUE CROSS/BLUE SHIELD | Source: Ambulatory Visit | Attending: Physical Medicine and Rehabilitation | Admitting: Physical Medicine and Rehabilitation

## 2017-07-08 ENCOUNTER — Telehealth: Payer: Self-pay

## 2017-07-08 DIAGNOSIS — M5416 Radiculopathy, lumbar region: Secondary | ICD-10-CM

## 2017-07-08 MED ORDER — IOPAMIDOL (ISOVUE-M 200) INJECTION 41%
1.0000 mL | Freq: Once | INTRAMUSCULAR | Status: AC
  Administered 2017-07-08: 1 mL via EPIDURAL

## 2017-07-08 MED ORDER — METHYLPREDNISOLONE ACETATE 40 MG/ML INJ SUSP (RADIOLOG
120.0000 mg | Freq: Once | INTRAMUSCULAR | Status: AC
  Administered 2017-07-08: 120 mg via EPIDURAL

## 2017-07-08 NOTE — Discharge Instructions (Signed)

## 2017-07-08 NOTE — Telephone Encounter (Signed)
Pts spouse walked in office for Amitiza samples. Samples were given to spouse.

## 2017-07-11 NOTE — Progress Notes (Signed)
Phone call from patient, concern for numbness in lower extremities persistent from LUMB NRB on Friday. Informed Dr Mosetta PuttGrady. Per Dr Mosetta PuttGrady, pt to follow up with referring neurologist if symptoms persist or worsen, or go to ER for emergent neurological symptoms. Pt made aware of Dr Tobi BastosGrady's recommendations.

## 2017-07-29 ENCOUNTER — Encounter
Admission: RE | Admit: 2017-07-29 | Discharge: 2017-07-29 | Disposition: A | Discharge status: Home / Self Care | Payer: BLUE CROSS/BLUE SHIELD | Source: Ambulatory Visit | Attending: Neurosurgery | Admitting: Neurosurgery

## 2017-07-29 ENCOUNTER — Other Ambulatory Visit: Payer: Self-pay

## 2017-07-29 ENCOUNTER — Ambulatory Visit
Admission: RE | Admit: 2017-07-29 | Discharge: 2017-07-29 | Disposition: A | Discharge status: Home / Self Care | Payer: BLUE CROSS/BLUE SHIELD | Source: Ambulatory Visit | Attending: Neurosurgery | Admitting: Neurosurgery

## 2017-07-29 DIAGNOSIS — Z01818 Encounter for other preprocedural examination: Secondary | ICD-10-CM | POA: Diagnosis present

## 2017-07-29 DIAGNOSIS — G5601 Carpal tunnel syndrome, right upper limb: Secondary | ICD-10-CM | POA: Diagnosis not present

## 2017-07-29 DIAGNOSIS — Z419 Encounter for procedure for purposes other than remedying health state, unspecified: Secondary | ICD-10-CM

## 2017-07-29 DIAGNOSIS — Z01812 Encounter for preprocedural laboratory examination: Secondary | ICD-10-CM | POA: Diagnosis not present

## 2017-07-29 HISTORY — DX: Unspecified osteoarthritis, unspecified site: M19.90

## 2017-07-29 HISTORY — DX: Gastro-esophageal reflux disease without esophagitis: K21.9

## 2017-07-29 HISTORY — DX: Essential (primary) hypertension: I10

## 2017-07-29 LAB — URINALYSIS, ROUTINE W REFLEX MICROSCOPIC
Bacteria, UA: NONE SEEN
Bilirubin Urine: NEGATIVE
Glucose, UA: NEGATIVE mg/dL
Ketones, ur: NEGATIVE mg/dL
Leukocytes, UA: NEGATIVE
Nitrite: NEGATIVE
Protein, ur: NEGATIVE mg/dL
Specific Gravity, Urine: 1.02 (ref 1.005–1.030)
Squamous Epithelial / HPF: NONE SEEN
pH: 5 (ref 5.0–8.0)

## 2017-07-29 LAB — DIFFERENTIAL
BASOS ABS: 0 10*3/uL (ref 0–0.1)
BASOS PCT: 0 %
EOS ABS: 0.1 10*3/uL (ref 0–0.7)
EOS PCT: 1 %
LYMPHS ABS: 2.3 10*3/uL (ref 1.0–3.6)
Lymphocytes Relative: 27 %
Monocytes Absolute: 0.9 10*3/uL (ref 0.2–1.0)
Monocytes Relative: 11 %
NEUTROS PCT: 61 %
Neutro Abs: 5.4 10*3/uL (ref 1.4–6.5)

## 2017-07-29 LAB — PROTIME-INR
INR: 0.97
Prothrombin Time: 12.8 s (ref 11.4–15.2)

## 2017-07-29 LAB — BASIC METABOLIC PANEL WITH GFR
Anion gap: 9 (ref 5–15)
BUN: 12 mg/dL (ref 6–20)
CO2: 25 mmol/L (ref 22–32)
Calcium: 9.3 mg/dL (ref 8.9–10.3)
Chloride: 103 mmol/L (ref 101–111)
Creatinine, Ser: 1.07 mg/dL (ref 0.61–1.24)
GFR calc Af Amer: >60 mL/min
GFR calc non Af Amer: >60 mL/min
Glucose, Bld: 100 mg/dL — ABNORMAL HIGH (ref 65–99)
Potassium: 3.8 mmol/L (ref 3.5–5.1)
Sodium: 137 mmol/L (ref 135–145)

## 2017-07-29 LAB — CBC
HCT: 42.7 % (ref 40.0–52.0)
Hemoglobin: 14.2 g/dL (ref 13.0–18.0)
MCH: 29.6 pg (ref 26.0–34.0)
MCHC: 33.2 g/dL (ref 32.0–36.0)
MCV: 89.1 fL (ref 80.0–100.0)
PLATELETS: 363 10*3/uL (ref 150–440)
RBC: 4.79 MIL/uL (ref 4.40–5.90)
RDW: 13.1 % (ref 11.5–14.5)
WBC: 8.8 10*3/uL (ref 3.8–10.6)

## 2017-07-29 LAB — APTT: APTT: 29 s (ref 24–36)

## 2017-07-29 NOTE — Patient Instructions (Signed)
Your procedure is scheduled on:Mon 08/08/17 Report to Day Surgery. To find out your arrival time please call [phone] between 1PM - 3PM on Friday. 08/05/17.  Remember: Instructions that are not followed completely may result in serious medical risk, up to and including death, or upon the discretion of your surgeon and anesthesiologist your surgery may need to be rescheduled.     _X__ 1. Do not eat food after midnight the night before your procedure.                 No gum chewing or hard candies. You may drink clear liquids up to 2 hours                 before you are scheduled to arrive for your surgery- DO not drink clear                 liquids within 2 hours of the start of your surgery.                 Clear Liquids include:  water, apple juice without pulp, clear carbohydrate                 drink such as Clearfast of Gartorade, Black Coffee or Tea (Do not add                 anything to coffee or tea).  __X__2.  On the morning of surgery brush your teeth with toothpaste and water, you  may rinse your mouth with mouthwash if you wish.  Do not swallow any              toothpaste of mouthwash.     ___ 3.  No Alcohol for 24 hours before or after surgery.   ___ 4.  Do Not Smoke or use e-cigarettes For 24 Hours Prior to Your Surgery.                 Do not use any chewable tobacco products for at least 6 hours prior to                 surgery.  ____  5.  Bring all medications with you on the day of surgery if instructed.   ___x_  6.  Notify your doctor if there is any change in your medical condition      (cold, fever, infections).     Do not wear jewelry, make-up, hairpins, clips or nail polish. Do not wear lotions, powders, or perfumes. You may wear deodorant. Do not shave 48 hours prior to surgery. Men may shave face and neck. Do not bring valuables to the hospital.    [facility] is not responsible for any belongings or valuables.  Contacts, dentures or  bridgework may not be worn into surgery. Leave your suitcase in the car. After surgery it may be brought to your room. For patients admitted to the hospital, discharge time is determined by your treatment team.   Patients discharged the day of surgery will not be allowed to drive home.   Please read over the following fact sheets that you were given:    _x___ Take these medicines the morning of surgery with A SIP OF WATER:    1.benzonatate (TESSALON) 100 MG capsule if needed   2. gabapentin (NEURONTIN) 100 MG capsule  3. sucralfate (CARAFATE) 1 g tablet  4.  5.  6.  ____ Fleet Enema (as directed)   _x___ Use CHG Soap  as directed  ____ Use inhalers on the day of surgery  ____ Stop metformin 2 days prior to surgery    ____ Take 1/2 of usual insulin dose the night before surgery. No insulin the morning          of surgery.   ____ Stop Coumadin/Plavix/aspirin on   _x___ Stop Anti-inflammatories on May take tylenol for pain but no Ibuprofen Aleve or BC powder   __x__ Stop supplements until after surgery. Mon 4/1Biotin w/ Vitamins C & E (HAIR/SKIN/NAILS PO)   ____ Bring C-Pap to the hospital.

## 2017-08-08 ENCOUNTER — Other Ambulatory Visit: Payer: Self-pay

## 2017-08-08 ENCOUNTER — Ambulatory Visit: Payer: BLUE CROSS/BLUE SHIELD | Admitting: Certified Registered Nurse Anesthetist

## 2017-08-08 ENCOUNTER — Ambulatory Visit
Admission: RE | Admit: 2017-08-08 | Discharge: 2017-08-08 | Disposition: A | Discharge status: Home / Self Care | Payer: BLUE CROSS/BLUE SHIELD | Source: Ambulatory Visit | Attending: Neurosurgery | Admitting: Neurosurgery

## 2017-08-08 ENCOUNTER — Encounter
Admission: RE | Disposition: A | Discharge status: Home / Self Care | Payer: Self-pay | Source: Ambulatory Visit | Attending: Neurosurgery

## 2017-08-08 ENCOUNTER — Encounter: Payer: Self-pay | Admitting: *Deleted

## 2017-08-08 DIAGNOSIS — I10 Essential (primary) hypertension: Secondary | ICD-10-CM | POA: Insufficient documentation

## 2017-08-08 DIAGNOSIS — M1990 Unspecified osteoarthritis, unspecified site: Secondary | ICD-10-CM | POA: Insufficient documentation

## 2017-08-08 DIAGNOSIS — K219 Gastro-esophageal reflux disease without esophagitis: Secondary | ICD-10-CM | POA: Insufficient documentation

## 2017-08-08 DIAGNOSIS — Z79899 Other long term (current) drug therapy: Secondary | ICD-10-CM | POA: Insufficient documentation

## 2017-08-08 DIAGNOSIS — G5602 Carpal tunnel syndrome, left upper limb: Secondary | ICD-10-CM | POA: Insufficient documentation

## 2017-08-08 HISTORY — PX: CARPAL TUNNEL RELEASE: SHX101

## 2017-08-08 SURGERY — CARPAL TUNNEL RELEASE
Anesthesia: General | Site: Wrist | Laterality: Left | Wound class: Clean

## 2017-08-08 MED ORDER — DEXAMETHASONE SODIUM PHOSPHATE 10 MG/ML IJ SOLN
INTRAMUSCULAR | Status: DC | PRN
  Administered 2017-08-08: 10 mg via INTRAVENOUS

## 2017-08-08 MED ORDER — LACTATED RINGERS IV SOLN
INTRAVENOUS | Status: DC | PRN
  Administered 2017-08-08: 07:00:00 via INTRAVENOUS

## 2017-08-08 MED ORDER — FENTANYL CITRATE (PF) 100 MCG/2ML IJ SOLN
INTRAMUSCULAR | Status: AC
  Filled 2017-08-08: qty 2

## 2017-08-08 MED ORDER — TRAMADOL HCL 50 MG PO TABS: 50.0000 mg | ORAL_TABLET | Freq: Four times a day (QID) | ORAL | 0 refills | Status: DC | PRN

## 2017-08-08 MED ORDER — SODIUM CHLORIDE FLUSH 0.9 % IV SOLN
INTRAVENOUS | Status: AC
  Filled 2017-08-08: qty 10

## 2017-08-08 MED ORDER — LIDOCAINE HCL (PF) 1 % IJ SOLN
INTRAMUSCULAR | Status: AC
  Filled 2017-08-08: qty 30

## 2017-08-08 MED ORDER — LIDOCAINE HCL 1 % IJ SOLN
INTRAMUSCULAR | Status: DC | PRN
  Administered 2017-08-08: 7 mL

## 2017-08-08 MED ORDER — PHENYLEPHRINE HCL 10 MG/ML IJ SOLN
INTRAMUSCULAR | Status: DC | PRN
  Administered 2017-08-08: 100 ug via INTRAVENOUS

## 2017-08-08 MED ORDER — LIDOCAINE HCL (CARDIAC) 20 MG/ML IV SOLN
INTRAVENOUS | Status: DC | PRN
  Administered 2017-08-08: 50 mg via INTRAVENOUS

## 2017-08-08 MED ORDER — MIDAZOLAM HCL 2 MG/2ML IJ SOLN
INTRAMUSCULAR | Status: DC | PRN
  Administered 2017-08-08: 2 mg via INTRAVENOUS

## 2017-08-08 MED ORDER — PROPOFOL 500 MG/50ML IV EMUL
INTRAVENOUS | Status: AC
  Filled 2017-08-08: qty 50

## 2017-08-08 MED ORDER — PROPOFOL 10 MG/ML IV BOLUS
INTRAVENOUS | Status: DC | PRN
  Administered 2017-08-08: 150 mg via INTRAVENOUS

## 2017-08-08 MED ORDER — TRAMADOL HCL 50 MG PO TABS
50.0000 mg | ORAL_TABLET | Freq: Four times a day (QID) | ORAL | Status: DC
  Administered 2017-08-08: 50 mg via ORAL

## 2017-08-08 MED ORDER — ONDANSETRON HCL 4 MG/2ML IJ SOLN
INTRAMUSCULAR | Status: DC | PRN
  Administered 2017-08-08: 4 mg via INTRAVENOUS

## 2017-08-08 MED ORDER — MIDAZOLAM HCL 2 MG/2ML IJ SOLN
INTRAMUSCULAR | Status: AC
  Filled 2017-08-08: qty 2

## 2017-08-08 MED ORDER — ONDANSETRON HCL 4 MG/2ML IJ SOLN: 4.0000 mg | Freq: Once | INTRAMUSCULAR | Status: DC | PRN

## 2017-08-08 MED ORDER — FENTANYL CITRATE (PF) 100 MCG/2ML IJ SOLN
INTRAMUSCULAR | Status: DC | PRN
  Administered 2017-08-08: 25 ug via INTRAVENOUS

## 2017-08-08 MED ORDER — PROPOFOL 10 MG/ML IV BOLUS
INTRAVENOUS | Status: AC
  Filled 2017-08-08: qty 20

## 2017-08-08 MED ORDER — FENTANYL CITRATE (PF) 100 MCG/2ML IJ SOLN
25.0000 ug | INTRAMUSCULAR | Status: DC | PRN
  Administered 2017-08-08 (×4): 25 ug via INTRAVENOUS

## 2017-08-08 MED ORDER — LACTATED RINGERS IV SOLN
INTRAVENOUS | Status: DC
  Administered 2017-08-08: 07:00:00 via INTRAVENOUS

## 2017-08-08 MED ORDER — FENTANYL CITRATE (PF) 100 MCG/2ML IJ SOLN
INTRAMUSCULAR | Status: AC
  Administered 2017-08-08: 25 ug via INTRAVENOUS
  Filled 2017-08-08: qty 2

## 2017-08-08 MED ORDER — DEXTROSE 5 % IV SOLN: 2.0000 g | INTRAVENOUS | Status: DC

## 2017-08-08 MED ORDER — CEFAZOLIN SODIUM-DEXTROSE 2-4 GM/100ML-% IV SOLN
2.0000 g | INTRAVENOUS | Status: AC
  Administered 2017-08-08: 2 g via INTRAVENOUS
  Filled 2017-08-08: qty 100

## 2017-08-08 MED ORDER — TRAMADOL HCL 50 MG PO TABS
ORAL_TABLET | ORAL | Status: AC
  Administered 2017-08-08: 50 mg via ORAL
  Filled 2017-08-08: qty 1

## 2017-08-08 SURGICAL SUPPLY — 32 items
ADH SKN CLS APL DERMABOND .7 (GAUZE/BANDAGES/DRESSINGS) ×1
BNDG GAUZE 4.5X4.1 6PLY STRL (MISCELLANEOUS) ×2 IMPLANT
CANISTER SUCT 1200ML W/VALVE (MISCELLANEOUS) ×2 IMPLANT
CHLORAPREP W/TINT 26ML (MISCELLANEOUS) ×4 IMPLANT
CORD BIP STRL DISP 12FT (MISCELLANEOUS) ×2 IMPLANT
DERMABOND ADVANCED (GAUZE/BANDAGES/DRESSINGS) ×1
DERMABOND ADVANCED .7 DNX12 (GAUZE/BANDAGES/DRESSINGS) ×1 IMPLANT
ELECT CAUTERY BLADE TIP 2.5 (TIP) ×2
ELECTRODE CAUTERY BLDE TIP 2.5 (TIP) ×1 IMPLANT
FORCEPS JEWEL BIP 4-3/4 STR (INSTRUMENTS) ×2 IMPLANT
GAUZE PETRO XEROFOAM 1X8 (MISCELLANEOUS) ×2 IMPLANT
GAUZE SPONGE 4X4 12PLY STRL (GAUZE/BANDAGES/DRESSINGS) ×4 IMPLANT
GLOVE BIOGEL PI IND STRL 8 (GLOVE) ×1 IMPLANT
GLOVE BIOGEL PI INDICATOR 8 (GLOVE) ×1
GLOVE SURG SYN 8.0 (GLOVE) ×2 IMPLANT
GLOVE SURG SYN 8.0 PF PI (GLOVE) ×1 IMPLANT
GOWN STRL REUS W/ TWL LRG LVL3 (GOWN DISPOSABLE) ×2 IMPLANT
GOWN STRL REUS W/ TWL XL LVL3 (GOWN DISPOSABLE) ×1 IMPLANT
GOWN STRL REUS W/TWL LRG LVL3 (GOWN DISPOSABLE) ×4
GOWN STRL REUS W/TWL XL LVL3 (GOWN DISPOSABLE) ×2
KIT TURNOVER KIT A (KITS) ×2 IMPLANT
NS IRRIG 1000ML POUR BTL (IV SOLUTION) ×2 IMPLANT
PACK EXTREMITY ARMC (MISCELLANEOUS) ×2 IMPLANT
SOL PREP PVP 2OZ (MISCELLANEOUS) ×2
SOLUTION PREP PVP 2OZ (MISCELLANEOUS) ×1 IMPLANT
STOCKINETTE STRL 4IN 9604848 (GAUZE/BANDAGES/DRESSINGS) ×2 IMPLANT
SUT ETHILON 3-0 FS-10 30 BLK (SUTURE) ×2
SUT VIC AB 2-0 SH 27 (SUTURE) ×6
SUT VIC AB 2-0 SH 27XBRD (SUTURE) ×3 IMPLANT
SUT VIC AB 3-0 SH 27 (SUTURE) ×2
SUT VIC AB 3-0 SH 27X BRD (SUTURE) ×1 IMPLANT
SUTURE EHLN 3-0 FS-10 30 BLK (SUTURE) ×1 IMPLANT

## 2017-08-08 NOTE — Anesthesia Post-op Follow-up Note (Signed)
Anesthesia QCDR form completed.        

## 2017-08-08 NOTE — Anesthesia Postprocedure Evaluation (Signed)
Anesthesia Post Note  Patient: Jeffrey RumpsDeshanda M Saddler  Procedure(s) Performed: CARPAL TUNNEL RELEASE (Left Wrist)  Patient location during evaluation: PACU Anesthesia Type: General Level of consciousness: awake and alert Pain management: pain level controlled Vital Signs Assessment: post-procedure vital signs reviewed and stable Respiratory status: spontaneous breathing and respiratory function stable Cardiovascular status: stable Anesthetic complications: no     Last Vitals:  Vitals:   08/08/17 0638 08/08/17 0826  BP: 140/88   Pulse: 60 (P) 72  Resp: 18   Temp: (!) 36 C (!) (P) 35.8 C  SpO2: 100% (P) 100%    Last Pain:  Vitals:   08/08/17 0826  TempSrc:   PainSc: (P) Asleep                 KEPHART,WILLIAM K

## 2017-08-08 NOTE — Interval H&P Note (Signed)
History and Physical Interval Note:  08/08/2017 7:17 AM  Jeffrey Thomas  has presented today for surgery, with the diagnosis of carpal tunnel left hand  The various methods of treatment have been discussed with the patient and family. After consideration of risks, benefits and other options for treatment, the patient has consented to  Procedure(s): CARPAL TUNNEL RELEASE (Left) as a surgical intervention .  The patient's history has been reviewed, patient examined, no change in status, stable for surgery.  I have reviewed the patient's chart and labs.  Questions were answered to the patient's satisfaction.    His thumb weakness on left in APB appears more 4-/5 today. Opposition is a 4. I have told him I am unsure if this is all due to median neuropathy given previous thumb injury but worth proceeding with surgery to see if recovery in this and sensation.   Lucy ChrisSteven Clementine Soulliere

## 2017-08-08 NOTE — Discharge Instructions (Addendum)
NEUROSURGERY DISCHARGE INSTRUCTIONS  Admission Diagnosis: Carpal tunnel syndrome  Discharge Diagnosis: Carpal tunnel syndrome  Operative procedure: carpal tunnel release  The following are instructions to help in your recovery once you have been discharged from the hospital. Even if you feel well, it is important that you follow these activity guidelines.  What to do after you leave the hospital:  Recommended diet:  Increase protein intake to promote wound healing. You may return to your usual diet.  Be sure to stay hydrated.   Recommended activity:  Increase physical activity slowly as tolerated.   Until released by your doctor, you should not return to work or school. You should rest at home and let your body heal.   Do not take a tub bath or go swimming until approved by your doctor at your follow-up appointment.   If you smoke, we strongly recommend that you quit. Smoking has been proven to interfere with normal bone healing and will dramatically reduce the success rate of your surgery. Please contact [facility] (800-QUIT-NOW) and use the resources at www.[facility].com for assistance in stopping smoking.   Medications  Do not restart Aspirin until seven days after surgery  You may start anti-inflammatory medications for pain relief  You may restart home medications.   Wound Care Instructions  If you have a dressing on your incision, remove it two days after your surgery. Keep your incision area clean and dry.   If you have staples or stitches on your incision, you should have a follow up scheduled for removal. If you do not have staples or stitches, you will have steri-strips (small pieces of surgical tape) or Dermabond glue. The steri-strips/glue should begin to peel away within about a week (it is fine if the steri-strips fall off before then). If the strips are still in place one week after your surgery, you may gently remove them.    Please Report any of the  following:  However, should you experience any of the following, contact us immediately:   New numbness or weakness   Pain that is progressively getting worse, and is not relieved by your pain medication, muscle relaxers, rest, and warm compresses   Bleeding, redness, swelling, pain, or drainage from surgical incision   Chills or flu-like symptoms   Fever greater than 101.0 F (38.3 C)   Inability to eat, drink fluids, or take medications   Problems with bowel or bladder functions   Difficulty breathing or shortness of breath   Warmth, tenderness, or swelling in your calf    Additional Follow up appointments During office hours (Monday-Friday 9 am to 5 pm), please call your physician at [phone]  After hours and weekends, please call the [facility] at  [phone] and ask for the Neurosurgeon On Call   For a life-threatening emergency, call 911    Please see below for scheduled appointments:  AMBULATORY SURGERY  DISCHARGE INSTRUCTIONS   1) The drugs that you were given will stay in your system until tomorrow so for the next 24 hours you should not:  A) Drive an automobile B) Make any legal decisions C) Drink any alcoholic beverage   2) You may resume regular meals tomorrow.  Today it is better to start with liquids and gradually work up to solid foods.  You may eat anything you prefer, but it is better to start with liquids, then soup and crackers, and gradually work up to solid foods.   3) Please notify your doctor immediately if you  have any unusual bleeding, trouble breathing, redness and pain at the surgery site, drainage, fever, or pain not relieved by medication.    4) Additional Instructions:        Please contact your physician with any problems or Same Day Surgery at [phone], Monday through Friday 6 am to 4 pm, or [facility] number at [phone].

## 2017-08-08 NOTE — Op Note (Signed)
SURGERY DATE:08/08/2017  PRE-OP DIAGNOSIS: Carpal tunnel syndrome ofleftwrist [G56.02]   POST-OP DIAGNOSIS:Post-Op Diagnosis Codes: * Carpal tunnel syndrome of leftwrist [G56.02]  Procedure(s): NEUROPLASTY AND/OR TRANSPOSITION; MEDIAN NERVE AT CARPAL TUNNEL (Left)  SURGEON: Surgeon(s) and Role: Nathaniel Man* Sema Stangler Henry, MD - Primary Ivar DrapeFerri, Amanda, GeorgiaPA - Assisting  ANESTHESIA:General anesthesia  OPERATIVE FINDINGS:Compressive ligament atleft wrist  OPERATIVE REPORT: Indication Mr. Jeffrey HammansWilliamsonwas seen in clinic on3/12and found to have ongoing bilateralhand numbness and pain. An EMG/ NCS showed bilateralcarpal tunnel syndrome. This was interfering with hisdaily lifestyle.Hehad failed conservative management including medication and wrist splints. Surgery for decompression of the nerves were discussed. The risks of surgery including numbness and weakness in hand, pain, infection, and hematoma were discussed.He decided to proceed with the left hand  Procedure The patient was transferred to the operating room. The patient was given preoperative prophylactic IV antibiotics. For anesthesia, general anesthesia was delivered by the anesthesia service.The patient was positioned supine, positioned with theleftarm extended resting on an armboard. All pressure points were carefully padded. The planned incision was demarcated based at the wrist at the volar crease of the lefthand and the interspace between the third and fourth digit. A TIME OUT was performed   The entireleftarm was prepped and draped in standard sterile technique. An approximately 4-cm straight incision was performed in the proximal palm after infiltration with local anesthetic. The incision extended from distal wrist crease toward space between digits 3 and 4. It was extended down to the palmar fascia to the level of the flexor retinaculum. Subsequently, the distal transverse carpal ligament was  incised and the perineural fat exposed. Neuroplasty was carried out both proximally and distally, incising the entire transverse carpal ligament to release the entire median nerve. The median nerve was preserved intact throughout the entire procedure. The nerve was seen to be swollen due to compression. The compression distally was seen to be greater than proximally.. A blunt dissector was passed proximally and distally to ensure that the entire length of the flexor retinaculum had been incised and the nerve decompressed. There were no other points of nerve compression.   The wound was irrigated with saline solution until clear and hemostasis was meticulously achieved with bipolar electrocautery. The subdermis was closed with 3-0 vicryl. The skin was closed with a interrupted 3-0 nylon with vertical mattress sutures. The incision was dressed in a clean dry sterile dressing. All sponge counts, needle counts, and instrument counts were correct at the end of the case x 2. All pressure points remained padded throughout the entire case. The patient tolerated the procedure well without any complications and was transferred in stable condition to the PACU.    ESTIMATED BLOOD LOSS:5 cc   SPECIMENS: None  IMPLANT None  ATTESTATION  I performed the case in its entiretywith assistance of PA, Dorian FurnaceAmanda Ferri  Mitchell Iwanicki, MD [phone]

## 2017-08-08 NOTE — Progress Notes (Signed)
Cefazolin 2 grams x1 ordered for surgical prophylaxis per consult.  Fulton ReekMatt Kaimani Clayson, PharmD, BCPS  08/08/17 6:11 AM   \
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
## 2017-08-08 NOTE — Transfer of Care (Signed)
Immediate Anesthesia Transfer of Care Note  Patient: Jeffrey Thomas  Procedure(s) Performed: CARPAL TUNNEL RELEASE (Left Wrist)  Patient Location: PACU  Anesthesia Type:General  Level of Consciousness: awake, alert  and oriented  Airway & Oxygen Therapy: Patient Spontanous Breathing and Patient connected to face mask oxygen  Post-op Assessment: Report given to RN and Post -op Vital signs reviewed and stable  Post vital signs: stable  Last Vitals:  Vitals Value Taken Time  BP 133/92 08/08/2017  8:26 AM  Temp    Pulse    Resp 15 08/08/2017  8:28 AM  SpO2    Vitals shown include unvalidated device data.  Last Pain:  Vitals:   08/08/17 0638  TempSrc: Oral  PainSc: 5       Patients Stated Pain Goal: 3 (08/08/17 [id number])  Complications: No apparent anesthesia complications

## 2017-08-08 NOTE — Anesthesia Preprocedure Evaluation (Signed)
Anesthesia Evaluation  Patient identified by MRN, date of birth, ID band Patient awake    Reviewed: Allergy & Precautions, NPO status , Patient's Chart, lab work & pertinent test results  History of Anesthesia Complications Negative for: history of anesthetic complications  Airway Mallampati: II       Dental   Pulmonary neg sleep apnea, neg COPD,           Cardiovascular hypertension, Pt. on medications (-) Past MI and (-) CHF (-) dysrhythmias (-) Valvular Problems/Murmurs     Neuro/Psych neg Seizures    GI/Hepatic Neg liver ROS, GERD  Medicated and Controlled,  Endo/Other  neg diabetes  Renal/GU negative Renal ROS     Musculoskeletal   Abdominal   Peds  Hematology   Anesthesia Other Findings   Reproductive/Obstetrics                             Anesthesia Physical Anesthesia Plan  ASA: II  Anesthesia Plan: General   Post-op Pain Management:    Induction: Intravenous  PONV Risk Score and Plan: 2 and Dexamethasone, Ondansetron and Midazolam  Airway Management Planned: LMA  Additional Equipment:   Intra-op Plan:   Post-operative Plan:   Informed Consent: I have reviewed the patients History and Physical, chart, labs and discussed the procedure including the risks, benefits and alternatives for the proposed anesthesia with the patient or authorized representative who has indicated his/her understanding and acceptance.     Plan Discussed with:   Anesthesia Plan Comments:         Anesthesia Quick Evaluation

## 2017-08-08 NOTE — Interval H&P Note (Signed)
History and Physical Interval Note:  08/08/2017 6:53 AM  Jeffrey Thomas Jeffrey Thomas  has presented today for surgery, with the diagnosis of carpal tunnel left hand  The various methods of treatment have been discussed with the patient and family. After consideration of risks, benefits and other options for treatment, the patient has consented to  Procedure(s): CARPAL TUNNEL RELEASE (Left) as a surgical intervention .  The patient's history has been reviewed, patient examined, no change in status, stable for surgery.  I have reviewed the patient's chart and labs.  Questions were answered to the patient's satisfaction.     Lucy ChrisSteven Cataldo Cosgriff

## 2017-09-06 ENCOUNTER — Encounter: Payer: Self-pay | Admitting: Neurosurgery

## 2017-09-19 ENCOUNTER — Other Ambulatory Visit: Payer: Self-pay | Admitting: Neurosurgery

## 2017-09-19 DIAGNOSIS — R29898 Other symptoms and signs involving the musculoskeletal system: Secondary | ICD-10-CM

## 2017-09-20 ENCOUNTER — Other Ambulatory Visit: Payer: Self-pay | Admitting: Neurosurgery

## 2017-09-20 DIAGNOSIS — G894 Chronic pain syndrome: Secondary | ICD-10-CM

## 2017-09-20 DIAGNOSIS — R29898 Other symptoms and signs involving the musculoskeletal system: Secondary | ICD-10-CM

## 2017-09-27 ENCOUNTER — Ambulatory Visit: Payer: BLUE CROSS/BLUE SHIELD | Admitting: Nurse Practitioner

## 2017-10-04 ENCOUNTER — Ambulatory Visit
Admission: RE | Admit: 2017-10-04 | Discharge: 2017-10-04 | Disposition: A | Discharge status: Home / Self Care | Payer: BLUE CROSS/BLUE SHIELD | Source: Ambulatory Visit | Attending: Neurosurgery | Admitting: Neurosurgery

## 2017-10-04 DIAGNOSIS — G894 Chronic pain syndrome: Secondary | ICD-10-CM

## 2017-10-04 DIAGNOSIS — R29898 Other symptoms and signs involving the musculoskeletal system: Secondary | ICD-10-CM

## 2017-12-29 ENCOUNTER — Ambulatory Visit: Payer: BLUE CROSS/BLUE SHIELD | Admitting: Nurse Practitioner

## 2018-04-12 ENCOUNTER — Ambulatory Visit: Payer: BLUE CROSS/BLUE SHIELD | Admitting: Nurse Practitioner
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
## 2018-04-13 ENCOUNTER — Encounter
Admission: RE | Admit: 2018-04-13 | Discharge: 2018-04-13 | Disposition: A | Discharge status: Home / Self Care | Payer: BLUE CROSS/BLUE SHIELD | Source: Ambulatory Visit | Attending: Orthopedic Surgery | Admitting: Orthopedic Surgery

## 2018-04-13 ENCOUNTER — Other Ambulatory Visit: Payer: Self-pay

## 2018-04-13 ENCOUNTER — Other Ambulatory Visit: Payer: Self-pay | Admitting: Orthopedic Surgery

## 2018-04-13 NOTE — Patient Instructions (Signed)
Your procedure is scheduled on: 04-20-18 THURSDAY Report to Same Day Surgery 2nd floor medical mall [facility] Entrance-take elevator on left to 2nd floor.  Check in with surgery information desk.) To find out your arrival time please call [phone] between 1PM - 3PM on 04-19-18 [facility]  Remember: Instructions that are not followed completely may result in serious medical risk, up to and including death, or upon the discretion of your surgeon and anesthesiologist your surgery may need to be rescheduled.    _x___ 1. Do not eat food after midnight the night before your procedure. NO GUM OR CANDY AFTER MIDNIGHT.  You may drink clear liquids up to 2 hours before you are scheduled to arrive at the hospital for your procedure.  Do not drink clear liquids within 2 hours of your scheduled arrival to the hospital.  Clear liquids include  --Water or Apple juice without pulp  --Clear carbohydrate beverage such as ClearFast or Gatorade  --Black Coffee or Clear Tea (No milk, no creamers, do not add anything to the coffee or Tea   ____Ensure clear carbohydrate drink on the way to the hospital for bariatric patients  ____Ensure clear carbohydrate drink 3 hours before surgery for Dr Rutherford Nail patients if physician instructed.     __x__ 2. No Alcohol for 24 hours before or after surgery.   __x__3. No Smoking or e-cigarettes for 24 prior to surgery.  Do not use any chewable tobacco products for at least 6 hour prior to surgery   ____  4. Bring all medications with you on the day of surgery if instructed.    __x__ 5. Notify your doctor if there is any change in your medical condition     (cold, fever, infections).    x___6. On the morning of surgery brush your teeth with toothpaste and water.  You may rinse your mouth with mouth wash if you wish.  Do not swallow any toothpaste or mouthwash.   Do not wear jewelry, make-up, hairpins, clips or nail polish.  Do not wear lotions, powders, or  perfumes. You may wear deodorant.  Do not shave 48 hours prior to surgery. Men may shave face and neck.  Do not bring valuables to the hospital.    [facility] is not responsible for any belongings or valuables.               Contacts, dentures or bridgework may not be worn into surgery.  Leave your suitcase in the car. After surgery it may be brought to your room.  For patients admitted to the hospital, discharge time is determined by yourtreatment team.  _  Patients discharged the day of surgery will not be allowed to drive home.  You will need someone to drive you home and stay with you the night of your procedure.    Please read over the following fact sheets that you were given:   [facility] Preparing for Surgery  ____ Take anti-hypertensive listed below, cardiac, seizure, asthma, anti-reflux and psychiatric medicines. These include:  1. NONE  2.  3.  4.  5.  6.  ____Fleets enema or Magnesium Citrate as directed.   _x___ Use CHG Soap or sage wipes as directed on instruction sheet   ____ Use inhalers on the day of surgery and bring to hospital day of surgery  ____ Stop Metformin and Janumet 2 days prior to surgery.    ____ Take 1/2 of usual insulin dose the night before surgery and none on the  morning surgery.   ____ Follow recommendations from Cardiologist, Pulmonologist or PCP regarding stopping Aspirin, Coumadin, Plavix ,Eliquis, Effient, or Pradaxa, and Pletal.  X____Stop Anti-inflammatories such as Advil, Aleve, Ibuprofen, Motrin, Naproxen, Naprosyn, Goodies powders or aspirin products. OK to take Tylenol and Celebrex.   ____ Stop supplements until after surgery.     ____ Bring C-Pap to the hospital.

## 2018-04-18 ENCOUNTER — Other Ambulatory Visit: Payer: Self-pay

## 2018-04-18 ENCOUNTER — Encounter
Admission: RE | Admit: 2018-04-18 | Discharge: 2018-04-18 | Disposition: A | Discharge status: Home / Self Care | Payer: BLUE CROSS/BLUE SHIELD | Source: Ambulatory Visit | Attending: Orthopedic Surgery | Admitting: Orthopedic Surgery

## 2018-04-18 DIAGNOSIS — I10 Essential (primary) hypertension: Secondary | ICD-10-CM | POA: Insufficient documentation

## 2018-04-18 DIAGNOSIS — Z01818 Encounter for other preprocedural examination: Secondary | ICD-10-CM | POA: Diagnosis not present

## 2018-04-18 LAB — CBC WITH DIFFERENTIAL/PLATELET
Abs Immature Granulocytes: 0.08 10*3/uL — ABNORMAL HIGH (ref 0.00–0.07)
Basophils Absolute: 0 10*3/uL (ref 0.0–0.1)
Basophils Relative: 1 %
EOS PCT: 2 %
Eosinophils Absolute: 0.1 10*3/uL (ref 0.0–0.5)
HCT: 44.2 % (ref 39.0–52.0)
Hemoglobin: 14.6 g/dL (ref 13.0–17.0)
Immature Granulocytes: 1 %
Lymphocytes Relative: 30 %
Lymphs Abs: 2.2 10*3/uL (ref 0.7–4.0)
MCH: 29 pg (ref 26.0–34.0)
MCHC: 33 g/dL (ref 30.0–36.0)
MCV: 87.9 fL (ref 80.0–100.0)
Monocytes Absolute: 0.6 10*3/uL (ref 0.1–1.0)
Monocytes Relative: 8 %
Neutro Abs: 4.4 10*3/uL (ref 1.7–7.7)
Neutrophils Relative %: 58 %
Platelets: 301 10*3/uL (ref 150–400)
RBC: 5.03 MIL/uL (ref 4.22–5.81)
RDW: 12.3 % (ref 11.5–15.5)
WBC: 7.4 10*3/uL (ref 4.0–10.5)
nRBC: 0 % (ref 0.0–0.2)

## 2018-04-18 LAB — BASIC METABOLIC PANEL
ANION GAP: 9 (ref 5–15)
BUN: 15 mg/dL (ref 6–20)
CO2: 24 mmol/L (ref 22–32)
Calcium: 9.4 mg/dL (ref 8.9–10.3)
Chloride: 106 mmol/L (ref 98–111)
Creatinine, Ser: 1.11 mg/dL (ref 0.61–1.24)
GFR calc Af Amer: >60 mL/min (ref >60)
Glucose, Bld: 98 mg/dL (ref 70–99)
POTASSIUM: 3.9 mmol/L (ref 3.5–5.1)
SODIUM: 139 mmol/L (ref 135–145)

## 2018-04-18 LAB — PROTIME-INR
INR: 0.96
Prothrombin Time: 12.7 s (ref 11.4–15.2)

## 2018-04-18 LAB — APTT: APTT: 29 s (ref 24–36)

## 2018-04-19 MED ORDER — CEFAZOLIN SODIUM-DEXTROSE 2-4 GM/100ML-% IV SOLN
2.0000 g | INTRAVENOUS | Status: AC
  Administered 2018-04-20: 2 g via INTRAVENOUS

## 2018-04-20 ENCOUNTER — Ambulatory Visit: Payer: No Typology Code available for payment source | Admitting: Certified Registered"

## 2018-04-20 ENCOUNTER — Other Ambulatory Visit: Payer: Self-pay

## 2018-04-20 ENCOUNTER — Ambulatory Visit
Admission: RE | Admit: 2018-04-20 | Discharge: 2018-04-20 | Disposition: A | Discharge status: Home / Self Care | Payer: No Typology Code available for payment source | Attending: Orthopedic Surgery | Admitting: Orthopedic Surgery

## 2018-04-20 ENCOUNTER — Encounter
Admission: RE | Disposition: A | Discharge status: Home / Self Care | Payer: Self-pay | Source: Home / Self Care | Attending: Orthopedic Surgery

## 2018-04-20 DIAGNOSIS — Z79899 Other long term (current) drug therapy: Secondary | ICD-10-CM | POA: Diagnosis not present

## 2018-04-20 DIAGNOSIS — K219 Gastro-esophageal reflux disease without esophagitis: Secondary | ICD-10-CM | POA: Insufficient documentation

## 2018-04-20 DIAGNOSIS — M1990 Unspecified osteoarthritis, unspecified site: Secondary | ICD-10-CM | POA: Diagnosis not present

## 2018-04-20 DIAGNOSIS — M75111 Incomplete rotator cuff tear or rupture of right shoulder, not specified as traumatic: Secondary | ICD-10-CM | POA: Insufficient documentation

## 2018-04-20 DIAGNOSIS — M75101 Unspecified rotator cuff tear or rupture of right shoulder, not specified as traumatic: Secondary | ICD-10-CM | POA: Diagnosis present

## 2018-04-20 DIAGNOSIS — I10 Essential (primary) hypertension: Secondary | ICD-10-CM | POA: Insufficient documentation

## 2018-04-20 HISTORY — PX: SHOULDER OPEN ROTATOR CUFF REPAIR: SHX2407

## 2018-04-20 SURGERY — REPAIR, ROTATOR CUFF, OPEN
Anesthesia: General | Site: Shoulder | Laterality: Right

## 2018-04-20 MED ORDER — CHLORHEXIDINE GLUCONATE CLOTH 2 % EX PADS: 6.0000 | MEDICATED_PAD | Freq: Once | CUTANEOUS | Status: DC

## 2018-04-20 MED ORDER — DEXAMETHASONE SODIUM PHOSPHATE 10 MG/ML IJ SOLN
INTRAMUSCULAR | Status: AC
  Filled 2018-04-20: qty 1

## 2018-04-20 MED ORDER — EPINEPHRINE 30 MG/30ML IJ SOLN
INTRAMUSCULAR | Status: AC
  Filled 2018-04-20: qty 1

## 2018-04-20 MED ORDER — SUCCINYLCHOLINE CHLORIDE 20 MG/ML IJ SOLN
INTRAMUSCULAR | Status: DC | PRN
  Administered 2018-04-20: 120 mg via INTRAVENOUS

## 2018-04-20 MED ORDER — FAMOTIDINE 20 MG PO TABS
ORAL_TABLET | ORAL | Status: AC
  Filled 2018-04-20: qty 1

## 2018-04-20 MED ORDER — BUPIVACAINE HCL (PF) 0.25 % IJ SOLN
INTRAMUSCULAR | Status: DC | PRN
  Administered 2018-04-20: 30 mL

## 2018-04-20 MED ORDER — FENTANYL CITRATE (PF) 100 MCG/2ML IJ SOLN
INTRAMUSCULAR | Status: DC | PRN
  Administered 2018-04-20 (×2): 50 ug via INTRAVENOUS
  Administered 2018-04-20: 100 ug via INTRAVENOUS
  Administered 2018-04-20: 50 ug via INTRAVENOUS

## 2018-04-20 MED ORDER — NEOMYCIN-POLYMYXIN B GU 40-200000 IR SOLN
Status: DC | PRN
  Administered 2018-04-20: 2 mL

## 2018-04-20 MED ORDER — PHENYLEPHRINE HCL 10 MG/ML IJ SOLN
INTRAMUSCULAR | Status: AC
  Filled 2018-04-20: qty 1

## 2018-04-20 MED ORDER — ONDANSETRON HCL 4 MG/2ML IJ SOLN
INTRAMUSCULAR | Status: DC | PRN
  Administered 2018-04-20: 4 mg via INTRAVENOUS

## 2018-04-20 MED ORDER — LACTATED RINGERS IV SOLN
INTRAVENOUS | Status: DC
  Administered 2018-04-20: 1000 mL via INTRAVENOUS

## 2018-04-20 MED ORDER — ROCURONIUM BROMIDE 100 MG/10ML IV SOLN
INTRAVENOUS | Status: DC | PRN
  Administered 2018-04-20: 40 mg via INTRAVENOUS
  Administered 2018-04-20: 10 mg via INTRAVENOUS
  Administered 2018-04-20: 30 mg via INTRAVENOUS
  Administered 2018-04-20: 10 mg via INTRAVENOUS

## 2018-04-20 MED ORDER — SODIUM CHLORIDE 0.9 % IV SOLN
INTRAVENOUS | Status: DC | PRN
  Administered 2018-04-20: 30 ug/min via INTRAVENOUS

## 2018-04-20 MED ORDER — SEVOFLURANE IN SOLN
RESPIRATORY_TRACT | Status: AC
  Filled 2018-04-20: qty 250

## 2018-04-20 MED ORDER — LIDOCAINE HCL (PF) 1 % IJ SOLN
INTRAMUSCULAR | Status: AC
  Filled 2018-04-20: qty 30

## 2018-04-20 MED ORDER — FENTANYL CITRATE (PF) 100 MCG/2ML IJ SOLN
25.0000 ug | INTRAMUSCULAR | Status: DC | PRN
  Administered 2018-04-20 (×5): 25 ug via INTRAVENOUS

## 2018-04-20 MED ORDER — MIDAZOLAM HCL 2 MG/2ML IJ SOLN
INTRAMUSCULAR | Status: AC
  Filled 2018-04-20: qty 2

## 2018-04-20 MED ORDER — ROCURONIUM BROMIDE 50 MG/5ML IV SOLN
INTRAVENOUS | Status: AC
  Filled 2018-04-20: qty 1

## 2018-04-20 MED ORDER — ACETAMINOPHEN 10 MG/ML IV SOLN
INTRAVENOUS | Status: DC | PRN
  Administered 2018-04-20: 1000 mg via INTRAVENOUS

## 2018-04-20 MED ORDER — NEOMYCIN-POLYMYXIN B GU 40-200000 IR SOLN
Status: AC
  Filled 2018-04-20: qty 2

## 2018-04-20 MED ORDER — FENTANYL CITRATE (PF) 250 MCG/5ML IJ SOLN
INTRAMUSCULAR | Status: AC
  Filled 2018-04-20: qty 5

## 2018-04-20 MED ORDER — PROPOFOL 10 MG/ML IV BOLUS
INTRAVENOUS | Status: AC
  Filled 2018-04-20: qty 40

## 2018-04-20 MED ORDER — DEXAMETHASONE SODIUM PHOSPHATE 10 MG/ML IJ SOLN
INTRAMUSCULAR | Status: DC | PRN
  Administered 2018-04-20: 10 mg via INTRAVENOUS

## 2018-04-20 MED ORDER — EPHEDRINE SULFATE 50 MG/ML IJ SOLN
INTRAMUSCULAR | Status: AC
  Filled 2018-04-20: qty 1

## 2018-04-20 MED ORDER — PHENYLEPHRINE HCL 10 MG/ML IJ SOLN
INTRAMUSCULAR | Status: DC | PRN
  Administered 2018-04-20: 100 ug via INTRAVENOUS
  Administered 2018-04-20: 150 ug via INTRAVENOUS
  Administered 2018-04-20: 100 ug via INTRAVENOUS
  Administered 2018-04-20: 150 ug via INTRAVENOUS
  Administered 2018-04-20 (×2): 100 ug via INTRAVENOUS
  Administered 2018-04-20: 50 ug via INTRAVENOUS
  Administered 2018-04-20: 150 ug via INTRAVENOUS
  Administered 2018-04-20: 100 ug via INTRAVENOUS
  Administered 2018-04-20: 200 ug via INTRAVENOUS

## 2018-04-20 MED ORDER — LIDOCAINE HCL (CARDIAC) PF 100 MG/5ML IV SOSY
PREFILLED_SYRINGE | INTRAVENOUS | Status: DC | PRN
  Administered 2018-04-20: 100 mg via INTRAVENOUS

## 2018-04-20 MED ORDER — LIDOCAINE HCL (PF) 2 % IJ SOLN
INTRAMUSCULAR | Status: AC
  Filled 2018-04-20: qty 10

## 2018-04-20 MED ORDER — PROPOFOL 10 MG/ML IV BOLUS
INTRAVENOUS | Status: DC | PRN
  Administered 2018-04-20: 200 mg via INTRAVENOUS

## 2018-04-20 MED ORDER — EPINEPHRINE PF 1 MG/ML IJ SOLN
INTRAMUSCULAR | Status: DC | PRN
  Administered 2018-04-20: 13 mL

## 2018-04-20 MED ORDER — SUCCINYLCHOLINE CHLORIDE 20 MG/ML IJ SOLN
INTRAMUSCULAR | Status: AC
  Filled 2018-04-20: qty 1

## 2018-04-20 MED ORDER — BUPIVACAINE LIPOSOME 1.3 % IJ SUSP
INTRAMUSCULAR | Status: AC
  Filled 2018-04-20: qty 20

## 2018-04-20 MED ORDER — ONDANSETRON HCL 4 MG PO TABS: 4.0000 mg | ORAL_TABLET | Freq: Three times a day (TID) | ORAL | 0 refills | Status: AC | PRN

## 2018-04-20 MED ORDER — FENTANYL CITRATE (PF) 100 MCG/2ML IJ SOLN
INTRAMUSCULAR | Status: AC
  Filled 2018-04-20: qty 2

## 2018-04-20 MED ORDER — MIDAZOLAM HCL 2 MG/2ML IJ SOLN
INTRAMUSCULAR | Status: DC | PRN
  Administered 2018-04-20: 2 mg via INTRAVENOUS

## 2018-04-20 MED ORDER — OXYCODONE HCL 5 MG PO TABS
ORAL_TABLET | ORAL | Status: AC
  Administered 2018-04-20: 5 mg via ORAL
  Filled 2018-04-20: qty 1

## 2018-04-20 MED ORDER — OXYCODONE HCL 5 MG PO TABS: 5.0000 mg | ORAL_TABLET | ORAL | 0 refills | Status: DC | PRN

## 2018-04-20 MED ORDER — ACETAMINOPHEN 10 MG/ML IV SOLN
INTRAVENOUS | Status: AC
  Filled 2018-04-20: qty 100

## 2018-04-20 MED ORDER — EPHEDRINE SULFATE 50 MG/ML IJ SOLN
INTRAMUSCULAR | Status: DC | PRN
  Administered 2018-04-20: 10 mg via INTRAVENOUS

## 2018-04-20 MED ORDER — BUPIVACAINE HCL (PF) 0.5 % IJ SOLN
INTRAMUSCULAR | Status: AC
  Filled 2018-04-20: qty 10

## 2018-04-20 MED ORDER — ONDANSETRON HCL 4 MG/2ML IJ SOLN
INTRAMUSCULAR | Status: AC
  Filled 2018-04-20: qty 2

## 2018-04-20 MED ORDER — FENTANYL CITRATE (PF) 100 MCG/2ML IJ SOLN
INTRAMUSCULAR | Status: AC
  Administered 2018-04-20: 25 ug via INTRAVENOUS
  Filled 2018-04-20: qty 2

## 2018-04-20 MED ORDER — ONDANSETRON HCL 4 MG/2ML IJ SOLN: 4.0000 mg | Freq: Once | INTRAMUSCULAR | Status: DC | PRN

## 2018-04-20 MED ORDER — LIDOCAINE HCL (PF) 1 % IJ SOLN
INTRAMUSCULAR | Status: AC
  Filled 2018-04-20: qty 5

## 2018-04-20 MED ORDER — LIDOCAINE HCL 1 % IJ SOLN
INTRAMUSCULAR | Status: DC | PRN
  Administered 2018-04-20: 6 mL

## 2018-04-20 MED ORDER — DEXMEDETOMIDINE HCL 200 MCG/2ML IV SOLN
INTRAVENOUS | Status: DC | PRN
  Administered 2018-04-20 (×3): 4 ug via INTRAVENOUS

## 2018-04-20 MED ORDER — SUGAMMADEX SODIUM 500 MG/5ML IV SOLN
INTRAVENOUS | Status: AC
  Filled 2018-04-20: qty 5

## 2018-04-20 MED ORDER — FAMOTIDINE 20 MG PO TABS
20.0000 mg | ORAL_TABLET | Freq: Once | ORAL | Status: AC
  Administered 2018-04-20: 20 mg via ORAL

## 2018-04-20 MED ORDER — BUPIVACAINE HCL (PF) 0.25 % IJ SOLN
INTRAMUSCULAR | Status: AC
  Filled 2018-04-20: qty 30

## 2018-04-20 MED ORDER — OXYCODONE HCL 5 MG PO TABS
5.0000 mg | ORAL_TABLET | Freq: Once | ORAL | Status: AC
  Administered 2018-04-20: 5 mg via ORAL
  Filled 2018-04-20: qty 1

## 2018-04-20 MED ORDER — SUGAMMADEX SODIUM 500 MG/5ML IV SOLN
INTRAVENOUS | Status: DC | PRN
  Administered 2018-04-20: 400 mg via INTRAVENOUS

## 2018-04-20 MED ORDER — CEFAZOLIN SODIUM-DEXTROSE 2-4 GM/100ML-% IV SOLN
INTRAVENOUS | Status: AC
  Filled 2018-04-20: qty 100

## 2018-04-20 SURGICAL SUPPLY — 76 items
ADAPTER IRRIG TUBE 2 SPIKE SOL (ADAPTER) ×4 IMPLANT
ADPR TBG 2 SPK PMP STRL ASCP (ADAPTER) ×2
ANCH SUT 5.5 KNTLS PEEK (Orthopedic Implant) ×1 IMPLANT
ANCH SUT Q-FX 2.8 (Anchor) ×1 IMPLANT
ANCHOR ALL-SUT Q-FIX 2.8 (Anchor) ×1 IMPLANT
ANCHOR SUT 5.5 MULTIFIX (Orthopedic Implant) ×1 IMPLANT
BUR RADIUS 4.0X18.5 (BURR) ×2 IMPLANT
BUR RADIUS 5.5 (BURR) ×2 IMPLANT
CANNULA 5.75X7 CRYSTAL CLEAR (CANNULA) ×2 IMPLANT
CANNULA PARTIAL THREAD 2X7 (CANNULA) ×2 IMPLANT
CANNULA TWIST IN 8.25X9CM (CANNULA) IMPLANT
CONNECTOR PERFECT PASSER (CONNECTOR) ×2 IMPLANT
COOLER POLAR GLACIER W/PUMP (MISCELLANEOUS) ×2 IMPLANT
COVER WAND RF STERILE (DRAPES) ×1 IMPLANT
CRADLE LAMINECT ARM (MISCELLANEOUS) ×2 IMPLANT
DEVICE SUCT BLK HOLE OR FLOOR (MISCELLANEOUS) ×2 IMPLANT
DRAPE IMP U-DRAPE 54X76 (DRAPES) ×4 IMPLANT
DRAPE INCISE IOBAN 66X45 STRL (DRAPES) ×2 IMPLANT
DRAPE SHEET LG 3/4 BI-LAMINATE (DRAPES) ×2 IMPLANT
DRAPE U-SHAPE 47X51 STRL (DRAPES) IMPLANT
DURAPREP 26ML APPLICATOR (WOUND CARE) ×10 IMPLANT
ELECT REM PT RETURN 9FT ADLT (ELECTROSURGICAL) ×2
ELECTRODE REM PT RTRN 9FT ADLT (ELECTROSURGICAL) ×1 IMPLANT
GAUZE PETRO XEROFOAM 1X8 (MISCELLANEOUS) ×2 IMPLANT
GAUZE SPONGE 4X4 12PLY STRL (GAUZE/BANDAGES/DRESSINGS) ×4 IMPLANT
GLOVE BIOGEL PI IND STRL 7.5 (GLOVE) ×5 IMPLANT
GLOVE BIOGEL PI IND STRL 9 (GLOVE) ×1 IMPLANT
GLOVE BIOGEL PI INDICATOR 7.5 (GLOVE) ×5
GLOVE BIOGEL PI INDICATOR 9 (GLOVE) ×1
GLOVE SURG 9.0 ORTHO LTXF (GLOVE) ×4 IMPLANT
GOWN STRL REUS TWL 2XL XL LVL4 (GOWN DISPOSABLE) ×2 IMPLANT
GOWN STRL REUS W/ TWL LRG LVL3 (GOWN DISPOSABLE) ×1 IMPLANT
GOWN STRL REUS W/ TWL LRG LVL4 (GOWN DISPOSABLE) ×1 IMPLANT
GOWN STRL REUS W/TWL LRG LVL3 (GOWN DISPOSABLE) ×2
GOWN STRL REUS W/TWL LRG LVL4 (GOWN DISPOSABLE) ×4
IV LACTATED RINGER IRRG 3000ML (IV SOLUTION) ×26
IV LR IRRIG 3000ML ARTHROMATIC (IV SOLUTION) ×13 IMPLANT
KIT STABILIZATION SHOULDER (MISCELLANEOUS) ×2 IMPLANT
KIT SUTURE 2.8 Q-FIX DISP (MISCELLANEOUS) ×1 IMPLANT
KIT SUTURETAK 3.0 INSERT PERC (KITS) ×1 IMPLANT
KIT TURNOVER KIT A (KITS) ×2 IMPLANT
MANIFOLD NEPTUNE II (INSTRUMENTS) ×2 IMPLANT
MASK FACE SPIDER DISP (MASK) ×2 IMPLANT
MAT ABSORB  FLUID 56X50 GRAY (MISCELLANEOUS) ×2
MAT ABSORB FLUID 56X50 GRAY (MISCELLANEOUS) ×2 IMPLANT
NDL SAFETY ECLIPSE 18X1.5 (NEEDLE) ×1 IMPLANT
NEEDLE HYPO 18GX1.5 SHARP (NEEDLE) ×2
NEEDLE HYPO 22GX1.5 SAFETY (NEEDLE) ×2 IMPLANT
NS IRRIG 500ML POUR BTL (IV SOLUTION) ×4 IMPLANT
PACK ARTHROSCOPY SHOULDER (MISCELLANEOUS) ×2 IMPLANT
PAD WRAPON POLAR SHDR XLG (MISCELLANEOUS) ×1 IMPLANT
PASSER SUT CAPTURE FIRST (SUTURE) ×2 IMPLANT
SET TUBE SUCT SHAVER OUTFL 24K (TUBING) ×2 IMPLANT
SET TUBE TIP INTRA-ARTICULAR (MISCELLANEOUS) ×2 IMPLANT
SLEEVE PROTECTION STRL DISP (MISCELLANEOUS) ×2 IMPLANT
STRAP SAFETY 5IN WIDE (MISCELLANEOUS) ×2 IMPLANT
STRIP CLOSURE SKIN 1/2X4 (GAUZE/BANDAGES/DRESSINGS) ×4 IMPLANT
SUT ETHILON 4-0 (SUTURE) ×4
SUT ETHILON 4-0 FS2 18XMFL BLK (SUTURE) ×2
SUT LASSO 90 DEG SD STR (SUTURE) ×1 IMPLANT
SUT MNCRL 4-0 (SUTURE) ×2
SUT MNCRL 4-0 27XMFL (SUTURE) ×1
SUT PDS AB 0 CT1 27 (SUTURE) ×2 IMPLANT
SUT PERFECTPASSER WHITE CART (SUTURE) ×4 IMPLANT
SUT SMART STITCH CARTRIDGE (SUTURE) ×2 IMPLANT
SUT VIC AB 0 CT1 36 (SUTURE) ×2 IMPLANT
SUT VIC AB 2-0 CT2 27 (SUTURE) ×2 IMPLANT
SUTURE ETHLN 4-0 FS2 18XMF BLK (SUTURE) ×1 IMPLANT
SUTURE MAGNUM WIRE 2X48 BLK (SUTURE) IMPLANT
SUTURE MNCRL 4-0 27XMF (SUTURE) ×1 IMPLANT
SYR 10ML LL (SYRINGE) ×2 IMPLANT
TAPE MICROFOAM 4IN (TAPE) ×2 IMPLANT
TUBING ARTHRO INFLOW-ONLY STRL (TUBING) ×2 IMPLANT
TUBING CONNECTING 10 (TUBING) ×2 IMPLANT
WAND HAND CNTRL MULTIVAC 90 (MISCELLANEOUS) ×2 IMPLANT
WRAPON POLAR PAD SHDR XLG (MISCELLANEOUS) ×2

## 2018-04-20 NOTE — Anesthesia Procedure Notes (Signed)
Procedure Name: Intubation Date/Time: 04/20/2018 8:07 AM Performed by: Lavone Orn, CRNA Pre-anesthesia Checklist: Patient identified, Emergency Drugs available, Suction available, Patient being monitored and Timeout performed Patient Re-evaluated:Patient Re-evaluated prior to induction Oxygen Delivery Method: Circle system utilized Preoxygenation: Pre-oxygenation with 100% oxygen Induction Type: IV induction Ventilation: Mask ventilation without difficulty Laryngoscope Size: Mac and 4 Grade View: Grade I Tube type: Oral Tube size: 7.5 mm Number of attempts: 1 Airway Equipment and Method: Stylet Placement Confirmation: ETT inserted through vocal cords under direct vision,  positive ETCO2 and breath sounds checked- equal and bilateral Secured at: 24 cm Tube secured with: Tape Dental Injury: Teeth and Oropharynx as per pre-operative assessment

## 2018-04-20 NOTE — Transfer of Care (Signed)
Immediate Anesthesia Transfer of Care Note  Patient: Jeffrey Thomas  Procedure(s) Performed: ROTATOR CUFF REPAIR SHOULDER MINI OPEN, SUBACRIOMINAL DECOMPRESSION, SUPERIOR LABRAL REPAIR (Right Shoulder)  Patient Location: PACU  Anesthesia Type:General  Level of Consciousness: drowsy  Airway & Oxygen Therapy: Patient Spontanous Breathing and Patient connected to face mask  Post-op Assessment: Report given to RN and Post -op Vital signs reviewed and stable  Post vital signs: stable  Last Vitals:  Vitals Value Taken Time  BP 118/81 04/20/2018 11:15 AM  Temp 36 C 04/20/2018 11:15 AM  Pulse 80 04/20/2018 11:23 AM  Resp 15 04/20/2018 11:23 AM  SpO2 100 % 04/20/2018 11:23 AM  Vitals shown include unvalidated device data.  Last Pain:  Vitals:   04/20/18 1115  TempSrc:   PainSc: 0-No pain         Complications: No apparent anesthesia complications

## 2018-04-20 NOTE — Anesthesia Postprocedure Evaluation (Signed)
Anesthesia Post Note  Patient: Jeffrey Thomas  Procedure(s) Performed: ROTATOR CUFF REPAIR SHOULDER MINI OPEN, SUBACRIOMINAL DECOMPRESSION, SUPERIOR LABRAL REPAIR (Right Shoulder)  Patient location during evaluation: PACU Anesthesia Type: General Level of consciousness: awake and alert and oriented Pain management: pain level controlled Vital Signs Assessment: post-procedure vital signs reviewed and stable Respiratory status: spontaneous breathing Cardiovascular status: blood pressure returned to baseline Anesthetic complications: no     Last Vitals:  Vitals:   04/20/18 1233 04/20/18 1329  BP: 110/79 116/79  Pulse: 68 62  Resp: 16 16  Temp: (!) 36.1 C   SpO2: 96% 97%    Last Pain:  Vitals:   04/20/18 1329  TempSrc:   PainSc: 6                  Daemien Fronczak
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
## 2018-04-20 NOTE — Op Note (Signed)
04/20/2018  11:05 AM  PATIENT:  Jeffrey Thomas  37 y.o. male  PRE-OPERATIVE DIAGNOSIS:  HIGH GRADE PARTIAL THICKNESS ROTATOR CUFF TEAR RIGHT SHOULDER  POST-OPERATIVE DIAGNOSIS:  HIGH GRADE PARTIAL THICKNESS ROTATOR CUFF TEAR AND SUPERIOR LABRAL (SLAP) TEAR RIGHT SHOULDER  PROCEDURE:  Procedure(s): RIGHT SHOULDER ARTHROSCOPIC SLAP REPAIR AND SUBACRIOMINAL DECOMPRESSION AND MINI-OPEN ROTATOR   SURGEON:  Surgeon(s) and Role:    Thornton Park, MD - Primary  ANESTHESIA:   local and general   PREOPERATIVE INDICATIONS:  Carleton JADRIAN BULMAN is a  37 y.o. male with a diagnosis of HIGH GRADE PARTIAL THICKNESS ROTATOR CUFF TEAR RIGHT SHOULDER failed conservative treatment and elected for surgical management.    The risks benefits and alternatives were discussed with the patient preoperatively including but not limited to the risks of infection, bleeding, nerve injury, persistent pain or weakness, failure of the hardware, re-tear of the rotator cuff and the need for further surgery. Medical risks include DVT and pulmonary embolism, myocardial infarction, stroke, pneumonia, respiratory failure and death. Patient understood these risks and wished to proceed.  OPERATIVE IMPLANTS: ArthroCare Multifix anchors x 1 & Smith and Nephew Q Fix anchors x 1 for rotator cuff repair.  Two Arthrex BiosutureTak anchors for SLAP repair.  OPERATIVE FINDINGS: Superior labral tear and high-grade partial-thickness tear of the supraspinatus.  No biceps tendon tear.  Subscapularis is intact.  No focal chondral defects of the glenohumeral joint.  Abundant subacromial bursitis secondary to subacromial impingement.    OPERATIVE PROCEDURE: The patient was met in the preoperative area. The right shoulder was signed with the word yes and my initials according the hospital's correct site of surgery protocol.   History and physical was peformed.   Patient was brought to the operating room where he underwent general  anesthesia.  The patient was placed in a beachchair position.  A spider arm positioner was used for this case. Examination under anesthesia revealed no limitation of motion or instability with load shift testing. The patient had a negative sulcus sign.  Patient was prepped and draped in a sterile fashion. A timeout was performed to verify the patient's name, date of birth, medical record number, correct site of surgery and correct procedure to be performed there was also used to verify the patient received antibiotics that all appropriate instruments, implants and radiographs studies were available in the room. Once all in attendance were in agreement case began.  Bony landmarks were drawn out with a surgical marker along with proposed arthroscopy incisions. These were pre-injected with 1% lidocaine plain. An 11 blade was used to establish a posterior portal through which the arthroscope was placed in the glenohumeral joint. A full diagnostic examination of the shoulder was performed.  Patient was found to have peri-labral tear destabilizing the biceps anchor.  Decision was made to repair of the superior labral tear.  A second posterior lateral portal was established using an 18-gauge spinal needle for localization.  A 7 mm cannula was placed.  Superior glenoid was debrided with a 4.0 mm resector shaver blade on bur mode until punctate bleeding was identified.  Two Arthrex biosuture tak anchors were placed in the superior glenoid.  90 degree suture lasso was then used to shuttle one limb of each anchor under the superior labrum.  Arthroscopic knot tying technique was then used to repair the labrum to the glenoid.  The labrum was probed with a hook probe following repair and was found to be stable.  Excellent approximation of the  superior labrum to the bony glenoid was achieved.    The supraspinatus was severely frayed from the undersurface.  This was debrided with a 4.0 mm resector shaver blade.  A lateral  portal was then established again using 18-gauge spinal needle.  A 4.0 mm resector shaver blade was then used to complete the partial thickness supraspinatus tear.  The greater tuberosity footprint was then debrided with a 4.0 mm resector shaver blade until punctate bleeding was identified.  The arthroscope was then placed in the subacromial space.  Extensive bursitis was encountered and debrided using a 4.0 resector shaver blade and a 90 ArthroCare wand from a lateral portal which was established under direct visualization using an 18-gauge spinal needle. A subacromial decompression was also performed using a 5.5 mm resector shaver blade from the lateral portal. Two Perfect Pass sutures were placed in the lateral border of the rotator cuff tear. All arthroscopic instruments were then removed and the mini-open portion of the procedure began.  A saber-type incision was made along the lateral border of the acromion. The deltoid muscle was identified and split in line with its fibers which allowed visualization of the rotator cuff. The Perfect Pass sutures previously placed in the lateral border of the rotator cuff were brought out through the deltoid split.   A single Smith [employer] anchor was placed at the articular margin of the humeral head with the greater tuberosity. The suture limbs of the Q Fix anchor were passed medially through the rotator cuff using a First Pass suture passer.   The two Perfect Pass sutures were then anchored to the greater tuberosity footprint using a single Multifix anchors. These anchors were tensioned to allow for anatomic reduction of the rotator cuff to the greater tuberosity. The medial row sutures were then tied down using an arthroscopic knot tying technique.  Arthroscopic images of the repair were taken with the arthroscope both externally and from inside the glenohumeral joint.  All incisions were copiously irrigated. The deltoid fascia was repaired using a 0 Vicryl  suture.  The subcutaneous tissue of all incisions were closed with a 2-0 Vicryl. Skin closure for the arthroscopic incisions was performed with 4-0 nylon. The skin edges of the saber incision was approximated with a running 4-0 undyed Monocryl.  0.25% Marcaine plain was then injected into the subacromial space for postoperative pain control. A dry sterile dressing was applied.  The patient was placed in an abduction sling and a Polar Care was applied to the shoulder.  All sharp, sponge and instrument counts were correct at the conclusion of the case. I was scrubbed and present for the entire case. I spoke with the patient's family postoperatively to let them know the case had been performed without complication and the patient was stable in recovery room.
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
## 2018-04-20 NOTE — Discharge Instructions (Signed)
Surgery for Rotator Cuff Tear, Care After Refer to this sheet in the next few weeks. These instructions provide you with information about caring for yourself after your procedure. Your health care provider may also give you more specific instructions. Your treatment has been planned according to current medical practices, but problems sometimes occur. Call your health care provider if you have any problems or questions after your procedure. What can I expect after the procedure? After the procedure, it is common to have:  Swelling.  Pain.  Stiffness.  Tenderness. Follow these instructions at home: If you have a sling:   Wear the sling as told by your health care provider. Remove it only as told by your health care provider.  Loosen the sling if your fingers tingle, become numb, or turn cold and blue.  Do not let your sling get wet if it is not waterproof.  Keep the sling clean. Bathing  Do not take baths, swim, or use a hot tub until your health care provider approves. Ask your health care provider if you can take showers. You may only be allowed to take sponge baths for bathing.  Keep your bandage (dressing) dry until your health care provider says it can be removed. Incision care  Follow instructions from your health care provider about how to take care of your incision. Make sure you: ? Wash your hands with soap and water before you change your dressing. If soap and water are not available, use hand sanitizer. ? Change your dressing as told by your health care provider. ? Leave stitches (sutures), skin glue, or adhesive strips in place. These skin closures may need to stay in place for 2 weeks or longer. If adhesive strip edges start to loosen and curl up, you may trim the loose edges. Do not remove adhesive strips completely unless your health care provider tells you to do that.  Check your incision area every day for signs of infection. Check for: ? More redness, swelling,  or pain. ? More fluid or blood. ? Warmth. ? Pus or a bad smell. Managing pain, stiffness, and swelling   If directed, put ice on your shoulder area. ? Put ice in a plastic bag. ? Place a towel between your skin and the bag. ? Leave the ice on for 20 minutes, 2-3 times a day.  Move your fingers often to avoid stiffness and to lessen swelling.  Raise (elevate) your upper body on pillows when you lie down and when you sleep. ? Do not sleep on the front of your body (abdomen). ? Do not sleep on the side that your surgery was performed on. Driving  Do not drive for 24 hours if you received a medicine to help you relax (sedative) during your procedure.  Do not drive or operate heavy machinery while taking prescription pain medicine.  Ask your health care provider when it is safe for you to drive. Activity  Do not use your arm to support your body weight until your health care provider approves.  Do not lift or hold anything with your arm until your health care provider approves.  Return to your normal activities as told by your health care provider. Ask your health care provider what activities are safe for you.  Do exercises as told by your health care provider. General instructions   Do not use any tobacco products, such as cigarettes, chewing tobacco, or e-cigarettes. Tobacco can delay healing. If you need help quitting, ask your health care provider.  Take over-the-counter and prescription medicines only as told by your health care provider.  If you were prescribed an antibiotic medicine, take it as told by your health care provider. Do not stop taking the antibiotic even if you start to feel better.  Keep all follow-up visits as told by your health care provider. This is important. Contact a health care provider if:  You have a fever.  You have more redness, swelling, or pain around your incision.  You have more fluid or blood coming from your incision.  Your  incision feels warm to the touch.  You have pus or a bad smell coming from your incision.  You have pain that gets worse or does not get better with medicine. Get help right away if:  You have severe pain.  You lose feeling in your arm or hand.  Your hand or fingers turn very pale or blue. This information is not intended to replace advice given to you by your health care provider. Make sure you discuss any questions you have with your health care provider. Document Released: 04/19/2005 Document Revised: 12/16/2017 Document Reviewed: 05/03/2015 Elsevier Interactive Patient Education  2019 [employer]  AMBULATORY SURGERY  DISCHARGE INSTRUCTIONS   1) The drugs that you were given will stay in your system until tomorrow so for the next 24 hours you should not:  A) Drive an automobile B) Make any legal decisions C) Drink any alcoholic beverage   2) You may resume regular meals tomorrow.  Today it is better to start with liquids and gradually work up to solid foods.  You may eat anything you prefer, but it is better to start with liquids, then soup and crackers, and gradually work up to solid foods.   3) Please notify your doctor immediately if you have any unusual bleeding, trouble breathing, redness and pain at the surgery site, drainage, fever, or pain not relieved by medication.    4) Additional Instructions:        Please contact your physician with any problems or Same Day Surgery at [phone], Monday through Friday 6 am to 4 pm, or [place] at [facility] Main number at [phone].

## 2018-04-20 NOTE — Anesthesia Post-op Follow-up Note (Signed)
Anesthesia QCDR form completed.        

## 2018-07-29 ENCOUNTER — Other Ambulatory Visit: Payer: Self-pay

## 2018-07-29 ENCOUNTER — Emergency Department (HOSPITAL_COMMUNITY): Payer: BLUE CROSS/BLUE SHIELD

## 2018-07-29 ENCOUNTER — Encounter (HOSPITAL_COMMUNITY): Payer: Self-pay | Admitting: Emergency Medicine

## 2018-07-29 ENCOUNTER — Emergency Department (HOSPITAL_COMMUNITY)
Admission: EM | Admit: 2018-07-29 | Discharge: 2018-07-29 | Disposition: A | Discharge status: Home / Self Care | Payer: BLUE CROSS/BLUE SHIELD | Attending: Emergency Medicine | Admitting: Emergency Medicine

## 2018-07-29 DIAGNOSIS — R0600 Dyspnea, unspecified: Secondary | ICD-10-CM

## 2018-07-29 DIAGNOSIS — R0981 Nasal congestion: Secondary | ICD-10-CM

## 2018-07-29 DIAGNOSIS — R079 Chest pain, unspecified: Secondary | ICD-10-CM | POA: Diagnosis not present

## 2018-07-29 DIAGNOSIS — Z79899 Other long term (current) drug therapy: Secondary | ICD-10-CM | POA: Insufficient documentation

## 2018-07-29 DIAGNOSIS — R5383 Other fatigue: Secondary | ICD-10-CM | POA: Insufficient documentation

## 2018-07-29 DIAGNOSIS — I10 Essential (primary) hypertension: Secondary | ICD-10-CM | POA: Diagnosis not present

## 2018-07-29 LAB — CBC WITH DIFFERENTIAL/PLATELET
ABS IMMATURE GRANULOCYTES: 0.06 10*3/uL (ref 0.00–0.07)
Basophils Absolute: 0.1 10*3/uL (ref 0.0–0.1)
Basophils Relative: 0 %
Eosinophils Absolute: 0 10*3/uL (ref 0.0–0.5)
Eosinophils Relative: 0 %
HCT: 44.5 % (ref 39.0–52.0)
Hemoglobin: 14.7 g/dL (ref 13.0–17.0)
Immature Granulocytes: 0 %
Lymphocytes Relative: 22 %
Lymphs Abs: 2.9 10*3/uL (ref 0.7–4.0)
MCH: 29.5 pg (ref 26.0–34.0)
MCHC: 33 g/dL (ref 30.0–36.0)
MCV: 89.4 fL (ref 80.0–100.0)
MONO ABS: 1 10*3/uL (ref 0.1–1.0)
Monocytes Relative: 7 %
Neutro Abs: 9.4 10*3/uL — ABNORMAL HIGH (ref 1.7–7.7)
Neutrophils Relative %: 71 %
PLATELETS: 429 10*3/uL — AB (ref 150–400)
RBC: 4.98 MIL/uL (ref 4.22–5.81)
RDW: 12.5 % (ref 11.5–15.5)
WBC: 13.5 10*3/uL — AB (ref 4.0–10.5)
nRBC: 0 % (ref 0.0–0.2)

## 2018-07-29 LAB — COMPREHENSIVE METABOLIC PANEL
ALK PHOS: 56 U/L (ref 38–126)
ALT: 32 U/L (ref 0–44)
AST: 22 U/L (ref 15–41)
Albumin: 4.7 g/dL (ref 3.5–5.0)
Anion gap: 10 (ref 5–15)
BUN: 17 mg/dL (ref 6–20)
CO2: 22 mmol/L (ref 22–32)
Calcium: 9.5 mg/dL (ref 8.9–10.3)
Chloride: 107 mmol/L (ref 98–111)
Creatinine, Ser: 1.29 mg/dL — ABNORMAL HIGH (ref 0.61–1.24)
GFR calc Af Amer: >60 mL/min (ref >60)
GFR calc non Af Amer: >60 mL/min (ref >60)
Glucose, Bld: 113 mg/dL — ABNORMAL HIGH (ref 70–99)
POTASSIUM: 3.5 mmol/L (ref 3.5–5.1)
Sodium: 139 mmol/L (ref 135–145)
Total Bilirubin: 0.8 mg/dL (ref 0.3–1.2)
Total Protein: 8.1 g/dL (ref 6.5–8.1)

## 2018-07-29 LAB — D-DIMER, QUANTITATIVE (NOT AT ARMC): D DIMER QUANT: 0.33 ug{FEU}/mL (ref 0.00–0.50)

## 2018-07-29 LAB — TROPONIN I: Troponin I: <0.03 ng/mL (ref <0.03)

## 2018-07-29 MED ORDER — FLUTICASONE PROPIONATE 50 MCG/ACT NA SUSP: 1.0000 | Freq: Two times a day (BID) | NASAL | 0 refills | Status: DC

## 2018-07-29 MED ORDER — ALBUTEROL SULFATE HFA 108 (90 BASE) MCG/ACT IN AERS
1.0000 | INHALATION_SPRAY | Freq: Four times a day (QID) | RESPIRATORY_TRACT | Status: DC | PRN
  Administered 2018-07-29: 2 via RESPIRATORY_TRACT
  Filled 2018-07-29: qty 6.7

## 2018-07-29 MED ORDER — LORATADINE 10 MG PO TABS: 10.0000 mg | ORAL_TABLET | Freq: Every day | ORAL | 0 refills | Status: AC

## 2018-07-29 MED ORDER — ALBUTEROL SULFATE (2.5 MG/3ML) 0.083% IN NEBU
INHALATION_SOLUTION | RESPIRATORY_TRACT | Status: AC
  Filled 2018-07-29: qty 6

## 2018-07-29 MED ORDER — ALBUTEROL SULFATE (2.5 MG/3ML) 0.083% IN NEBU: 5.0000 mg | INHALATION_SOLUTION | Freq: Once | RESPIRATORY_TRACT | Status: DC

## 2018-07-29 NOTE — ED Triage Notes (Signed)
Patient c/o shortness of breath x5 days that is progressively getting worse. Denies any fevers or cough. Per patient some mid-sternal chest pain that started after shortness of breath and fatigue. Patient states thought it was related to pull muscle after going to rehab for rotator cuff.

## 2018-07-29 NOTE — Discharge Instructions (Addendum)
Take the medications as prescribed to see if that helps with your nasal congestion and difficulty breathing, follow-up with your primary care doctor if the symptoms are not improving, monitor for fever worsening symptoms
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
## 2018-09-13 ENCOUNTER — Other Ambulatory Visit: Payer: Self-pay | Admitting: Adult Health

## 2018-09-13 ENCOUNTER — Other Ambulatory Visit (HOSPITAL_COMMUNITY): Payer: Self-pay | Admitting: Adult Health

## 2018-09-13 DIAGNOSIS — R0602 Shortness of breath: Secondary | ICD-10-CM

## 2018-09-13 DIAGNOSIS — R079 Chest pain, unspecified: Secondary | ICD-10-CM

## 2018-09-20 ENCOUNTER — Ambulatory Visit (HOSPITAL_COMMUNITY): Payer: BLUE CROSS/BLUE SHIELD

## 2018-09-26 ENCOUNTER — Encounter (HOSPITAL_COMMUNITY): Payer: Self-pay

## 2018-09-26 ENCOUNTER — Ambulatory Visit (HOSPITAL_COMMUNITY): Payer: BLUE CROSS/BLUE SHIELD

## 2018-10-06 ENCOUNTER — Other Ambulatory Visit: Payer: Self-pay | Admitting: Orthopedic Surgery

## 2018-10-09 ENCOUNTER — Encounter
Admission: RE | Admit: 2018-10-09 | Discharge: 2018-10-09 | Disposition: A | Discharge status: Home / Self Care | Payer: No Typology Code available for payment source | Source: Ambulatory Visit | Attending: Orthopedic Surgery | Admitting: Orthopedic Surgery

## 2018-10-09 ENCOUNTER — Other Ambulatory Visit: Payer: Self-pay

## 2018-10-09 DIAGNOSIS — Z01818 Encounter for other preprocedural examination: Secondary | ICD-10-CM | POA: Insufficient documentation

## 2018-10-09 DIAGNOSIS — M7551 Bursitis of right shoulder: Secondary | ICD-10-CM | POA: Diagnosis not present

## 2018-10-09 DIAGNOSIS — M7541 Impingement syndrome of right shoulder: Secondary | ICD-10-CM | POA: Diagnosis not present

## 2018-10-09 DIAGNOSIS — Z79899 Other long term (current) drug therapy: Secondary | ICD-10-CM | POA: Diagnosis not present

## 2018-10-09 DIAGNOSIS — Z791 Long term (current) use of non-steroidal anti-inflammatories (NSAID): Secondary | ICD-10-CM | POA: Diagnosis not present

## 2018-10-09 DIAGNOSIS — Z01812 Encounter for preprocedural laboratory examination: Secondary | ICD-10-CM | POA: Insufficient documentation

## 2018-10-09 DIAGNOSIS — M19011 Primary osteoarthritis, right shoulder: Secondary | ICD-10-CM | POA: Diagnosis not present

## 2018-10-09 DIAGNOSIS — I10 Essential (primary) hypertension: Secondary | ICD-10-CM | POA: Diagnosis not present

## 2018-10-09 DIAGNOSIS — M7501 Adhesive capsulitis of right shoulder: Secondary | ICD-10-CM | POA: Diagnosis not present

## 2018-10-09 DIAGNOSIS — Z1159 Encounter for screening for other viral diseases: Secondary | ICD-10-CM | POA: Insufficient documentation

## 2018-10-09 LAB — CBC WITH DIFFERENTIAL/PLATELET
Abs Immature Granulocytes: 0.06 10*3/uL (ref 0.00–0.07)
Basophils Absolute: 0.1 10*3/uL (ref 0.0–0.1)
Basophils Relative: 1 %
Eosinophils Absolute: 0.1 10*3/uL (ref 0.0–0.5)
Eosinophils Relative: 1 %
HCT: 43.9 % (ref 39.0–52.0)
Hemoglobin: 14.6 g/dL (ref 13.0–17.0)
Immature Granulocytes: 1 %
Lymphocytes Relative: 25 %
Lymphs Abs: 2.7 10*3/uL (ref 0.7–4.0)
MCH: 29.6 pg (ref 26.0–34.0)
MCHC: 33.3 g/dL (ref 30.0–36.0)
MCV: 88.9 fL (ref 80.0–100.0)
Monocytes Absolute: 1 10*3/uL (ref 0.1–1.0)
Monocytes Relative: 9 %
Neutro Abs: 6.9 10*3/uL (ref 1.7–7.7)
Neutrophils Relative %: 63 %
Platelets: 405 10*3/uL — ABNORMAL HIGH (ref 150–400)
RBC: 4.94 MIL/uL (ref 4.22–5.81)
RDW: 12.3 % (ref 11.5–15.5)
WBC: 10.7 10*3/uL — ABNORMAL HIGH (ref 4.0–10.5)
nRBC: 0 % (ref 0.0–0.2)

## 2018-10-09 LAB — BASIC METABOLIC PANEL
Anion gap: 11 (ref 5–15)
BUN: 11 mg/dL (ref 6–20)
CO2: 24 mmol/L (ref 22–32)
Calcium: 9.8 mg/dL (ref 8.9–10.3)
Chloride: 105 mmol/L (ref 98–111)
Creatinine, Ser: 1.14 mg/dL (ref 0.61–1.24)
GFR calc Af Amer: >60 mL/min (ref >60)
GFR calc non Af Amer: >60 mL/min (ref >60)
Glucose, Bld: 105 mg/dL — ABNORMAL HIGH (ref 70–99)
Potassium: 3.4 mmol/L — ABNORMAL LOW (ref 3.5–5.1)
Sodium: 140 mmol/L (ref 135–145)

## 2018-10-09 LAB — PROTIME-INR
INR: 1 (ref 0.8–1.2)
Prothrombin Time: 13.5 seconds (ref 11.4–15.2)

## 2018-10-09 LAB — APTT: aPTT: 30 seconds (ref 24–36)

## 2018-10-09 NOTE — Patient Instructions (Signed)
Your procedure is scheduled on: 10/12/18 Report to Day Surgery. [facility] SECOND FLOOR To find out your arrival time please call [phone] between 1PM - 3PM on 10/11/18  Remember: Instructions that are not followed completely may result in serious medical risk,  up to and including death, or upon the discretion of your surgeon and anesthesiologist your  surgery may need to be rescheduled.     _X__ 1. Do not eat food after midnight the night before your procedure.                 No gum chewing or hard candies. You may drink clear liquids up to 2 hours                 before you are scheduled to arrive for your surgery- DO not drink clear                 liquids within 2 hours of the start of your surgery.                 Clear Liquids include:  water, apple juice without pulp, clear carbohydrate                 drink such as Clearfast of Gatorade, Black Coffee or Tea (Do not add                 anything to coffee or tea).  __X__2.  On the morning of surgery brush your teeth with toothpaste and water, you                may rinse your mouth with mouthwash if you wish.  Do not swallow any toothpaste of mouthwash.     _X__ 3.  No Alcohol for 24 hours before or after surgery.   _X__ 4.  Do Not Smoke or use e-cigarettes For 24 Hours Prior to Your Surgery.                 Do not use any chewable tobacco products for at least 6 hours prior to                 surgery.  ____  5.  Bring all medications with you on the day of surgery if instructed.   __X__  6.  Notify your doctor if there is any change in your medical condition      (cold, fever, infections).     Do not wear jewelry, make-up, hairpins, clips or nail polish. Do not wear lotions, powders, or perfumes. You may wear deodorant. Do not shave 48 hours prior to surgery. Men may shave face and neck. Do not bring valuables to the hospital.    [facility] is not responsible for any belongings or  valuables.  Contacts, dentures or bridgework may not be worn into surgery. Leave your suitcase in the car. After surgery it may be brought to your room. For patients admitted to the hospital, discharge time is determined by your treatment team.   Patients discharged the day of surgery will not be allowed to drive home.           _X___ Take these medicines the morning of surgery with A SIP OF WATER:    1. GABAPENTIN  2. SUCRALFATE  3.   4.  5.  6.  ____ Fleet Enema (as directed)   __X__ Use CHG Soap as directed  ____ Use inhalers on the day of surgery  ____ Stop  metformin 2 days prior to surgery    ____ Take 1/2 of usual insulin dose the night before surgery. No insulin the morning          of surgery.   ____ Stop Coumadin/Plavix/aspirin on   __X__ Stop Anti-inflammatories on   STOP VOLTAREN 10/09/18   ____ Stop supplements until after surgery.    ____ Bring C-Pap to the hospital.

## 2018-10-10 LAB — NOVEL CORONAVIRUS, NAA (HOSP ORDER, SEND-OUT TO REF LAB; TAT 18-24 HRS): SARS-CoV-2, NAA: NOT DETECTED

## 2018-10-11 MED ORDER — CEFAZOLIN SODIUM-DEXTROSE 2-4 GM/100ML-% IV SOLN
2.0000 g | INTRAVENOUS | Status: AC
  Administered 2018-10-12: 2 g via INTRAVENOUS

## 2018-10-12 ENCOUNTER — Other Ambulatory Visit: Payer: Self-pay

## 2018-10-12 ENCOUNTER — Encounter
Admission: RE | Disposition: A | Discharge status: Home / Self Care | Payer: Self-pay | Source: Home / Self Care | Attending: Orthopedic Surgery

## 2018-10-12 ENCOUNTER — Ambulatory Visit
Admission: RE | Admit: 2018-10-12 | Discharge: 2018-10-12 | Disposition: A | Discharge status: Home / Self Care | Payer: No Typology Code available for payment source | Attending: Orthopedic Surgery | Admitting: Orthopedic Surgery

## 2018-10-12 ENCOUNTER — Ambulatory Visit (HOSPITAL_COMMUNITY): Payer: BC Managed Care – PPO

## 2018-10-12 ENCOUNTER — Ambulatory Visit: Payer: No Typology Code available for payment source | Admitting: Anesthesiology

## 2018-10-12 DIAGNOSIS — Z79899 Other long term (current) drug therapy: Secondary | ICD-10-CM | POA: Insufficient documentation

## 2018-10-12 DIAGNOSIS — M19011 Primary osteoarthritis, right shoulder: Secondary | ICD-10-CM | POA: Insufficient documentation

## 2018-10-12 DIAGNOSIS — Z791 Long term (current) use of non-steroidal anti-inflammatories (NSAID): Secondary | ICD-10-CM | POA: Insufficient documentation

## 2018-10-12 DIAGNOSIS — M7541 Impingement syndrome of right shoulder: Secondary | ICD-10-CM | POA: Insufficient documentation

## 2018-10-12 DIAGNOSIS — Z1159 Encounter for screening for other viral diseases: Secondary | ICD-10-CM | POA: Insufficient documentation

## 2018-10-12 DIAGNOSIS — I10 Essential (primary) hypertension: Secondary | ICD-10-CM | POA: Insufficient documentation

## 2018-10-12 DIAGNOSIS — M7501 Adhesive capsulitis of right shoulder: Secondary | ICD-10-CM | POA: Diagnosis not present

## 2018-10-12 DIAGNOSIS — M7551 Bursitis of right shoulder: Secondary | ICD-10-CM | POA: Insufficient documentation

## 2018-10-12 HISTORY — PX: SHOULDER ARTHROSCOPY WITH LABRAL REPAIR: SHX5691

## 2018-10-12 SURGERY — ARTHROSCOPY, SHOULDER, WITH GLENOID LABRUM REPAIR
Anesthesia: General | Laterality: Right

## 2018-10-12 MED ORDER — HYDROMORPHONE HCL 1 MG/ML IJ SOLN: 0.2500 mg | INTRAMUSCULAR | Status: DC | PRN

## 2018-10-12 MED ORDER — KETAMINE HCL 50 MG/ML IJ SOLN
INTRAMUSCULAR | Status: AC
  Filled 2018-10-12: qty 10

## 2018-10-12 MED ORDER — REMIFENTANIL HCL 1 MG IV SOLR: INTRAVENOUS | Status: DC | PRN

## 2018-10-12 MED ORDER — SODIUM CHLORIDE 0.9 % IV SOLN
INTRAVENOUS | Status: DC | PRN
  Administered 2018-10-12: 30 ug/min via INTRAVENOUS

## 2018-10-12 MED ORDER — LACTATED RINGERS IV SOLN
INTRAVENOUS | Status: DC
  Administered 2018-10-12: 1000 mL via INTRAVENOUS

## 2018-10-12 MED ORDER — FAMOTIDINE 20 MG PO TABS
20.0000 mg | ORAL_TABLET | Freq: Once | ORAL | Status: AC
  Administered 2018-10-12: 07:00:00 20 mg via ORAL

## 2018-10-12 MED ORDER — MIDAZOLAM HCL 2 MG/2ML IJ SOLN
INTRAMUSCULAR | Status: DC | PRN
  Administered 2018-10-12: 2 mg via INTRAVENOUS

## 2018-10-12 MED ORDER — EPINEPHRINE (ANAPHYLAXIS) 30 MG/30ML IJ SOLN
INTRAMUSCULAR | Status: AC
  Filled 2018-10-12: qty 30

## 2018-10-12 MED ORDER — ROCURONIUM BROMIDE 100 MG/10ML IV SOLN
INTRAVENOUS | Status: DC | PRN
  Administered 2018-10-12: 50 mg via INTRAVENOUS

## 2018-10-12 MED ORDER — KETAMINE HCL 10 MG/ML IJ SOLN
INTRAMUSCULAR | Status: DC | PRN
  Administered 2018-10-12: 50 mg via INTRAVENOUS

## 2018-10-12 MED ORDER — PROMETHAZINE HCL 25 MG/ML IJ SOLN
INTRAMUSCULAR | Status: AC
  Administered 2018-10-12: 6.25 mg via INTRAVENOUS
  Filled 2018-10-12: qty 1

## 2018-10-12 MED ORDER — KETOROLAC TROMETHAMINE 30 MG/ML IJ SOLN
INTRAMUSCULAR | Status: DC | PRN
  Administered 2018-10-12: 30 mg via INTRAVENOUS

## 2018-10-12 MED ORDER — FENTANYL CITRATE (PF) 100 MCG/2ML IJ SOLN
INTRAMUSCULAR | Status: DC | PRN
  Administered 2018-10-12: 50 ug via INTRAVENOUS
  Administered 2018-10-12 (×2): 100 ug via INTRAVENOUS

## 2018-10-12 MED ORDER — PROPOFOL 10 MG/ML IV BOLUS
INTRAVENOUS | Status: DC | PRN
  Administered 2018-10-12: 180 mg via INTRAVENOUS

## 2018-10-12 MED ORDER — ACETAMINOPHEN 10 MG/ML IV SOLN
INTRAVENOUS | Status: AC
  Filled 2018-10-12: qty 100

## 2018-10-12 MED ORDER — KETOROLAC TROMETHAMINE 30 MG/ML IJ SOLN
INTRAMUSCULAR | Status: AC
  Filled 2018-10-12: qty 1

## 2018-10-12 MED ORDER — SUGAMMADEX SODIUM 200 MG/2ML IV SOLN
INTRAVENOUS | Status: DC | PRN
  Administered 2018-10-12: 171.8 mg via INTRAVENOUS

## 2018-10-12 MED ORDER — HYDROMORPHONE HCL 1 MG/ML IJ SOLN
INTRAMUSCULAR | Status: AC
  Filled 2018-10-12: qty 1

## 2018-10-12 MED ORDER — SODIUM CHLORIDE FLUSH 0.9 % IV SOLN
INTRAVENOUS | Status: AC
  Filled 2018-10-12: qty 10

## 2018-10-12 MED ORDER — PROMETHAZINE HCL 25 MG/ML IJ SOLN
6.2500 mg | INTRAMUSCULAR | Status: DC
  Administered 2018-10-12: 14:00:00 6.25 mg via INTRAVENOUS

## 2018-10-12 MED ORDER — LIDOCAINE HCL (PF) 1 % IJ SOLN
INTRAMUSCULAR | Status: AC
  Filled 2018-10-12: qty 30

## 2018-10-12 MED ORDER — BUPIVACAINE HCL (PF) 0.25 % IJ SOLN
INTRAMUSCULAR | Status: AC
  Filled 2018-10-12: qty 30

## 2018-10-12 MED ORDER — OXYCODONE HCL 5 MG PO TABS
ORAL_TABLET | ORAL | Status: AC
  Filled 2018-10-12: qty 1

## 2018-10-12 MED ORDER — BUPIVACAINE HCL (PF) 0.25 % IJ SOLN
INTRAMUSCULAR | Status: DC | PRN
  Administered 2018-10-12: 30 mL

## 2018-10-12 MED ORDER — ACETAMINOPHEN 10 MG/ML IV SOLN
INTRAVENOUS | Status: DC | PRN
  Administered 2018-10-12: 1000 mg via INTRAVENOUS

## 2018-10-12 MED ORDER — FENTANYL CITRATE (PF) 250 MCG/5ML IJ SOLN
INTRAMUSCULAR | Status: AC
  Filled 2018-10-12: qty 5

## 2018-10-12 MED ORDER — CHLORHEXIDINE GLUCONATE CLOTH 2 % EX PADS: 6.0000 | MEDICATED_PAD | Freq: Once | CUTANEOUS | Status: DC

## 2018-10-12 MED ORDER — PROPOFOL 10 MG/ML IV BOLUS
INTRAVENOUS | Status: AC
  Filled 2018-10-12: qty 20

## 2018-10-12 MED ORDER — LIDOCAINE HCL (PF) 1 % IJ SOLN
INTRAMUSCULAR | Status: DC | PRN
  Administered 2018-10-12: 30 mL

## 2018-10-12 MED ORDER — DEXMEDETOMIDINE HCL 200 MCG/2ML IV SOLN
INTRAVENOUS | Status: DC | PRN
  Administered 2018-10-12: 12 ug via INTRAVENOUS
  Administered 2018-10-12: 8 ug via INTRAVENOUS

## 2018-10-12 MED ORDER — MIDAZOLAM HCL 2 MG/2ML IJ SOLN
INTRAMUSCULAR | Status: AC
  Filled 2018-10-12: qty 2

## 2018-10-12 MED ORDER — DEXAMETHASONE SODIUM PHOSPHATE 10 MG/ML IJ SOLN
INTRAMUSCULAR | Status: AC
  Filled 2018-10-12: qty 1

## 2018-10-12 MED ORDER — SUCCINYLCHOLINE CHLORIDE 20 MG/ML IJ SOLN
INTRAMUSCULAR | Status: AC
  Filled 2018-10-12: qty 2

## 2018-10-12 MED ORDER — CEFAZOLIN SODIUM-DEXTROSE 1-4 GM/50ML-% IV SOLN: INTRAVENOUS | Status: DC | PRN

## 2018-10-12 MED ORDER — LIDOCAINE HCL (PF) 2 % IJ SOLN
INTRAMUSCULAR | Status: AC
  Filled 2018-10-12: qty 10

## 2018-10-12 MED ORDER — DEXAMETHASONE SODIUM PHOSPHATE 10 MG/ML IJ SOLN
INTRAMUSCULAR | Status: DC | PRN
  Administered 2018-10-12: 10 mg via INTRAVENOUS

## 2018-10-12 MED ORDER — PHENYLEPHRINE HCL (PRESSORS) 10 MG/ML IV SOLN
INTRAVENOUS | Status: DC | PRN
  Administered 2018-10-12 (×2): 100 ug via INTRAVENOUS

## 2018-10-12 MED ORDER — OXYCODONE HCL 5 MG PO TABS: 5.0000 mg | ORAL_TABLET | ORAL | 0 refills | Status: AC | PRN

## 2018-10-12 MED ORDER — ROCURONIUM BROMIDE 50 MG/5ML IV SOLN
INTRAVENOUS | Status: AC
  Filled 2018-10-12: qty 1

## 2018-10-12 MED ORDER — ONDANSETRON HCL 4 MG/2ML IJ SOLN
INTRAMUSCULAR | Status: DC | PRN
  Administered 2018-10-12: 4 mg via INTRAVENOUS

## 2018-10-12 MED ORDER — LIDOCAINE 2% (20 MG/ML) 5 ML SYRINGE
INTRAMUSCULAR | Status: DC | PRN
  Administered 2018-10-12: 100 mg via INTRAVENOUS

## 2018-10-12 MED ORDER — LACTATED RINGERS IV SOLN
INTRAVENOUS | Status: DC | PRN
  Administered 2018-10-12: 10:00:00 10 mL

## 2018-10-12 MED ORDER — ONDANSETRON HCL 4 MG/2ML IJ SOLN
INTRAMUSCULAR | Status: AC
  Filled 2018-10-12: qty 2

## 2018-10-12 MED ORDER — OXYCODONE HCL 5 MG PO TABS
5.0000 mg | ORAL_TABLET | Freq: Once | ORAL | Status: AC
  Administered 2018-10-12: 5 mg via ORAL
  Filled 2018-10-12: qty 1

## 2018-10-12 MED ORDER — FAMOTIDINE 20 MG PO TABS
ORAL_TABLET | ORAL | Status: AC
  Filled 2018-10-12: qty 1

## 2018-10-12 MED ORDER — CEFAZOLIN SODIUM-DEXTROSE 2-4 GM/100ML-% IV SOLN
INTRAVENOUS | Status: AC
  Filled 2018-10-12: qty 100

## 2018-10-12 SURGICAL SUPPLY — 65 items
ADAPTER IRRIG TUBE 2 SPIKE SOL (ADAPTER) ×4 IMPLANT
ADPR TBG 2 SPK PMP STRL ASCP (ADAPTER) ×2
BUR RADIUS 4.0X18.5 (BURR) ×2 IMPLANT
BUR RADIUS 5.5 (BURR) ×2 IMPLANT
CANISTER SUCT LVC 12 LTR MEDI- (MISCELLANEOUS) IMPLANT
CANNULA 5.75X7 CRYSTAL CLEAR (CANNULA) ×4 IMPLANT
CANNULA PARTIAL THREAD 2X7 (CANNULA) ×1 IMPLANT
CANNULA TWIST IN 8.25X9CM (CANNULA) IMPLANT
COOLER POLAR GLACIER W/PUMP (MISCELLANEOUS) ×2 IMPLANT
COVER WAND RF STERILE (DRAPES) ×2 IMPLANT
CRADLE LAMINECT ARM (MISCELLANEOUS) ×2 IMPLANT
DEVICE SUCT BLK HOLE OR FLOOR (MISCELLANEOUS) IMPLANT
DRAPE IMP U-DRAPE 54X76 (DRAPES) ×4 IMPLANT
DRAPE INCISE IOBAN 66X45 STRL (DRAPES) ×2 IMPLANT
DRAPE SHEET LG 3/4 BI-LAMINATE (DRAPES) ×2 IMPLANT
DRAPE U-SHAPE 47X51 STRL (DRAPES) IMPLANT
DRESSING ALLEVYN LIFE SACRUM (GAUZE/BANDAGES/DRESSINGS) ×2 IMPLANT
DURAPREP 26ML APPLICATOR (WOUND CARE) ×6 IMPLANT
ELECT REM PT RETURN 9FT ADLT (ELECTROSURGICAL)
ELECTRODE REM PT RTRN 9FT ADLT (ELECTROSURGICAL) IMPLANT
GAUZE SPONGE 4X4 12PLY STRL (GAUZE/BANDAGES/DRESSINGS) ×2 IMPLANT
GAUZE XEROFORM 1X8 LF (GAUZE/BANDAGES/DRESSINGS) ×2 IMPLANT
GLOVE BIOGEL PI IND STRL 9 (GLOVE) ×1 IMPLANT
GLOVE BIOGEL PI INDICATOR 9 (GLOVE) ×1
GLOVE SURG 9.0 ORTHO LTXF (GLOVE) ×4 IMPLANT
GOWN STRL REUS TWL 2XL XL LVL4 (GOWN DISPOSABLE) ×2 IMPLANT
GOWN STRL REUS W/ TWL LRG LVL3 (GOWN DISPOSABLE) ×1 IMPLANT
GOWN STRL REUS W/ TWL LRG LVL4 (GOWN DISPOSABLE) ×1 IMPLANT
GOWN STRL REUS W/TWL LRG LVL3 (GOWN DISPOSABLE) ×2
GOWN STRL REUS W/TWL LRG LVL4 (GOWN DISPOSABLE) ×2
IV LACTATED RINGER IRRG 3000ML (IV SOLUTION) ×26
IV LR IRRIG 3000ML ARTHROMATIC (IV SOLUTION) ×13 IMPLANT
KIT STABILIZATION SHOULDER (MISCELLANEOUS) ×2 IMPLANT
KIT SUTURETAK 3.0 INSERT PERC (KITS) IMPLANT
KIT TURNOVER KIT A (KITS) ×2 IMPLANT
MANIFOLD NEPTUNE II (INSTRUMENTS) ×2 IMPLANT
MASK FACE SPIDER DISP (MASK) ×2 IMPLANT
MAT ABSORB  FLUID 56X50 GRAY (MISCELLANEOUS) ×2
MAT ABSORB FLUID 56X50 GRAY (MISCELLANEOUS) ×2 IMPLANT
NDL HPO THNWL 1X22GA REG BVL (NEEDLE) ×1 IMPLANT
NEEDLE HYPO 22GX1.5 SAFETY (NEEDLE) ×2 IMPLANT
NEEDLE SAFETY 22GX1 (NEEDLE) ×2
PACK ARTHROSCOPY SHOULDER (MISCELLANEOUS) ×2 IMPLANT
PAD WRAPON POLAR SHDR XLG (MISCELLANEOUS) ×1 IMPLANT
SET TUBE SUCT SHAVER OUTFL 24K (TUBING) ×2 IMPLANT
SET TUBE TIP INTRA-ARTICULAR (MISCELLANEOUS) ×2 IMPLANT
SLING ARM LRG DEEP (SOFTGOODS) ×1 IMPLANT
STRAP SAFETY 5IN WIDE (MISCELLANEOUS) ×2 IMPLANT
STRIP CLOSURE SKIN 1/2X4 (GAUZE/BANDAGES/DRESSINGS) ×2 IMPLANT
SUT ETHILON 4-0 (SUTURE) ×2
SUT ETHILON 4-0 FS2 18XMFL BLK (SUTURE) ×1
SUT LASSO 90 DEG SD STR (SUTURE) ×1 IMPLANT
SUT MNCRL 4-0 (SUTURE) ×2
SUT MNCRL 4-0 27XMFL (SUTURE) ×1
SUT PDS AB 0 CT1 27 (SUTURE) IMPLANT
SUT VIC AB 0 CT1 36 (SUTURE) IMPLANT
SUT VIC AB 2-0 CT2 27 (SUTURE) IMPLANT
SUTURE ETHLN 4-0 FS2 18XMF BLK (SUTURE) ×1 IMPLANT
SUTURE MAGNUM WIRE 2X48 BLK (SUTURE) IMPLANT
SUTURE MNCRL 4-0 27XMF (SUTURE) ×1 IMPLANT
TAPE MICROFOAM 4IN (TAPE) ×2 IMPLANT
TUBING ARTHRO INFLOW-ONLY STRL (TUBING) ×2 IMPLANT
TUBING CONNECTING 10 (TUBING) ×2 IMPLANT
WAND HAND CNTRL MULTIVAC 90 (MISCELLANEOUS) ×2 IMPLANT
WRAPON POLAR PAD SHDR XLG (MISCELLANEOUS) ×2

## 2018-10-12 NOTE — Op Note (Signed)
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 male with a diagnosis of adhesive capsulitis, impingement and AC joint arthrosis of right shoulder following previous rotator cuff repair and SLAP repairafter who failed conservative measures and elected for surgical management.    I discussed the risks and benefits of surgery. The risks include but are not limited to infection, bleeding, nerve or blood vessel injury, joint stiffness or loss of motion, persistent pain, weakness or instability, and the need for further surgery. Medical risks include but are not limited to DVT and pulmonary embolism, myocardial infarction, stroke, pneumonia, respiratory failure and death. Patient understood these risks and wished to proceed.   OPERATIVE FINDINGS: The right rotator cuff repair was intact and biceps tendon were intact. There were adhesions connected to the biceps tendon.  There was anterior capsular thickening and adhesions.  Patient had a severe subacromial bursitis and advanced acromioclavicular joint arthrosis.  OPERATIVE PROCEDURE: The patient was met in the preoperative area. The right shoulder was signed with the word yes and my initials according the hospital's correct site of surgery protocol.  A preop history and physical was performed. Patient was brought to the operating room where he underwent general anesthesia. The patient was placed in a beachchair position.  A spider arm positioner was used for this case. Examination under anesthesia revealed no significant limitation of motion except approximately 45  degrees of external rotation with the shoulder adducted.  There was no instability with load shift testing. The patient had a negative sulcus sign.  Patient was prepped and draped in a sterile fashion. A timeout was performed to verify the patient's name, date of birth, medical record number, correct site of surgery and correct procedure to be performed there was also used to verify the patient received antibiotics that all appropriate instruments, implants and radiographs studies were available in the room. Once all in attendance were in agreement case began.  Bony landmarks were drawn out with a surgical marker along with proposed arthroscopy incisions. These were pre-injected with 1% lidocaine plain. An 11 blade was used to establish a posterior portal through which the arthroscope was placed in the glenohumeral joint. A full diagnostic examination of the shoulder was performed. There was no tear of the supraspinatus identified.  The patient's previous rotator cuff tear was intact.  Sutures were intact.    Patient had significant capsular thickening and adhesions in the anterior aspect of the shoulder.  Some of these adhesions were connected to the subscapularis and biceps tendons.  Arthroscopic lysis of adhesions was performed with a 90 degree ArthroCare wand until the biceps tendon and subscapularis were free of adhesions and fully mobile.  Patient's previous SLAP repair was also intact.  There is no labral detachment seen with 360 degrees evaluation and of the glenoid labrum.  The arthroscope was then placed in the subacromial space.  Extensive bursitis was encountered. A lateral portal was established with an 18-gauge spinal needle for localization. A 90 ArthroCare wand and 40 resector shaver blade were used to form an extensive subdeltoid and subacromial bursectomy.  There was no evidence of a bursal sided tear of the supraspinatus  or infraspinatus to probing from the bursal side of the cuff.   A  revision subacromial decompression was performed using a 5.5 mm resector shaver blade from the lateral portal. The 5.5 mm resector shaver blade was then placed through the anterior portal.  A distal clavicle excision was performed. Subacromial space was then copiously irrigated to remove all osseous debris. Final arthroscopic images were taken. Arthroscopic instruments were then removed.  Skin closure for the arthroscopic incisions was performed with 4-0 nylon.   0.25% Marcaine plain was then injected into the subacromial space for postoperative pain control. A dry sterile dressing was applied.  The patient was placed in a sling and  a Polar Care sleeve.  All sharp and instrument counts were correct at the conclusion of the case. I was scrubbed and present for the entire case. I spoke with the patient's wife by phone due to [place] restrictions postoperatively to let her know the case had been performed without complication and the patient was stable in recovery room.

## 2018-10-12 NOTE — Anesthesia Post-op Follow-up Note (Signed)
Anesthesia QCDR form completed.        
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
## 2018-10-12 NOTE — Anesthesia Procedure Notes (Signed)
Procedure Name: Intubation Date/Time: 10/12/2018 9:30 AM Performed by: Marsh Dolly, CRNA Pre-anesthesia Checklist: Patient identified, Patient being monitored, Timeout performed, Emergency Drugs available and Suction available Patient Re-evaluated:Patient Re-evaluated prior to induction Oxygen Delivery Method: Circle system utilized Preoxygenation: Pre-oxygenation with 100% oxygen Induction Type: IV induction Ventilation: Mask ventilation without difficulty Laryngoscope Size: 3, McGraph and 4 Grade View: Grade I Tube type: Oral Tube size: 7.0 mm Number of attempts: 1 Airway Equipment and Method: Stylet Placement Confirmation: ETT inserted through vocal cords under direct vision,  positive ETCO2 and breath sounds checked- equal and bilateral Secured at: 21 cm Tube secured with: Tape Dental Injury: Teeth and Oropharynx as per pre-operative assessment  Comments: Atraumatic x 1 . Lidocaine ointment ETT

## 2018-10-12 NOTE — Anesthesia Postprocedure Evaluation (Signed)
Anesthesia Post Note  Patient: Jeffrey Thomas  Procedure(s) Performed: SHOULDER ARTHROSCOPY WITH DECOMPRESSION (Right )  Patient location during evaluation: PACU Anesthesia Type: General Level of consciousness: awake and alert Pain management: pain level controlled Vital Signs Assessment: post-procedure vital signs reviewed and stable Respiratory status: spontaneous breathing, nonlabored ventilation and respiratory function stable Cardiovascular status: blood pressure returned to baseline and stable Postop Assessment: no apparent nausea or vomiting Anesthetic complications: no     Last Vitals:  Vitals:   10/12/18 1224 10/12/18 1302  BP: 126/74 126/77  Pulse: (!) 58 83  Resp: 14 16  Temp: (!) 36.3 C   SpO2: 99% 100%    Last Pain:  Vitals:   10/12/18 1302  TempSrc:   PainSc: [place]

## 2018-10-12 NOTE — Discharge Instructions (Signed)

## 2018-10-12 NOTE — Transfer of Care (Signed)
Immediate Anesthesia Transfer of Care Note  Patient: Jeffrey Thomas  Procedure(s) Performed: SHOULDER ARTHROSCOPY WITH DECOMPRESSION (Right )  Patient Location: PACU  Anesthesia Type:General  Level of Consciousness: awake, alert  and oriented  Airway & Oxygen Therapy: Patient Spontanous Breathing and Patient connected to nasal cannula oxygen  Post-op Assessment: Report given to RN and Post -op Vital signs reviewed and stable  Post vital signs: Reviewed and stable  Last Vitals:  Vitals Value Taken Time  BP 119/96 10/12/18 1039  Temp    Pulse 65 10/12/18 1042  Resp 13 10/12/18 1042  SpO2 99 % 10/12/18 1042  Vitals shown include unvalidated device data.  Last Pain:  Vitals:   10/12/18 0650  TempSrc: Tympanic  PainSc:          Complications: No apparent anesthesia complications

## 2018-10-13 ENCOUNTER — Encounter: Payer: Self-pay | Admitting: Orthopedic Surgery

## 2018-11-09 ENCOUNTER — Other Ambulatory Visit: Payer: Self-pay

## 2018-11-09 ENCOUNTER — Ambulatory Visit (HOSPITAL_COMMUNITY)
Admission: RE | Admit: 2018-11-09 | Discharge: 2018-11-09 | Disposition: A | Discharge status: Home / Self Care | Payer: BC Managed Care – PPO | Source: Ambulatory Visit | Attending: Adult Health | Admitting: Adult Health

## 2018-11-09 DIAGNOSIS — R0602 Shortness of breath: Secondary | ICD-10-CM | POA: Diagnosis not present

## 2018-11-09 DIAGNOSIS — R079 Chest pain, unspecified: Secondary | ICD-10-CM | POA: Insufficient documentation

## 2018-11-09 MED ORDER — IOHEXOL 350 MG/ML SOLN
100.0000 mL | Freq: Once | INTRAVENOUS | Status: AC | PRN
  Administered 2018-11-09: 100 mL via INTRAVENOUS
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
## 2019-05-24 IMAGING — CR DG CHEST 2V
1 series · 2 of 2 positions shown · non-contrast
Comparison: 08/11/2012

CLINICAL DATA: Preoperative evaluation for upcoming surgery

EXAM:
CHEST - 2 VIEW

[Series 1: w chest pa · 0.14mm/px · 2 of 2 slices shown]
[im 1/2]
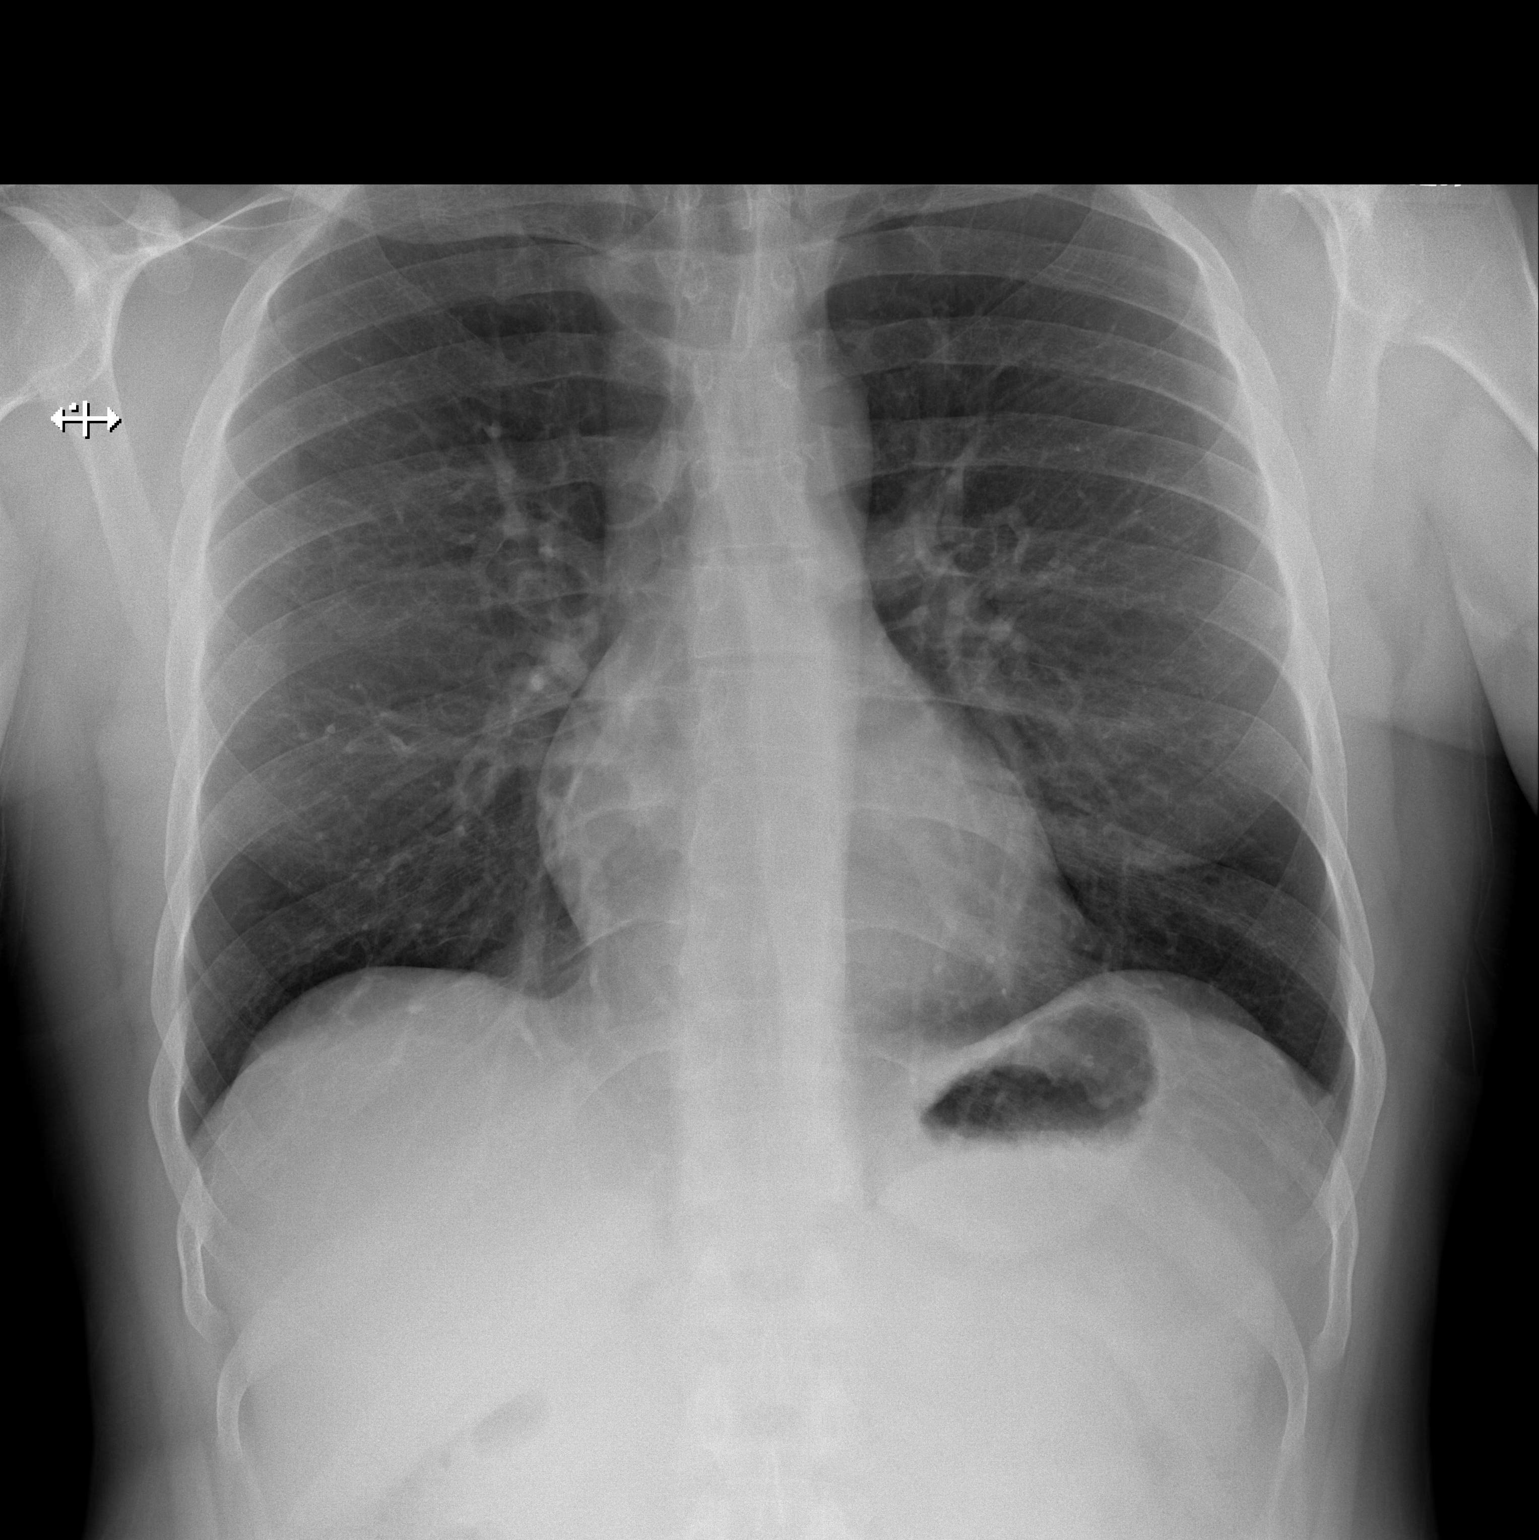
[im 2/2]
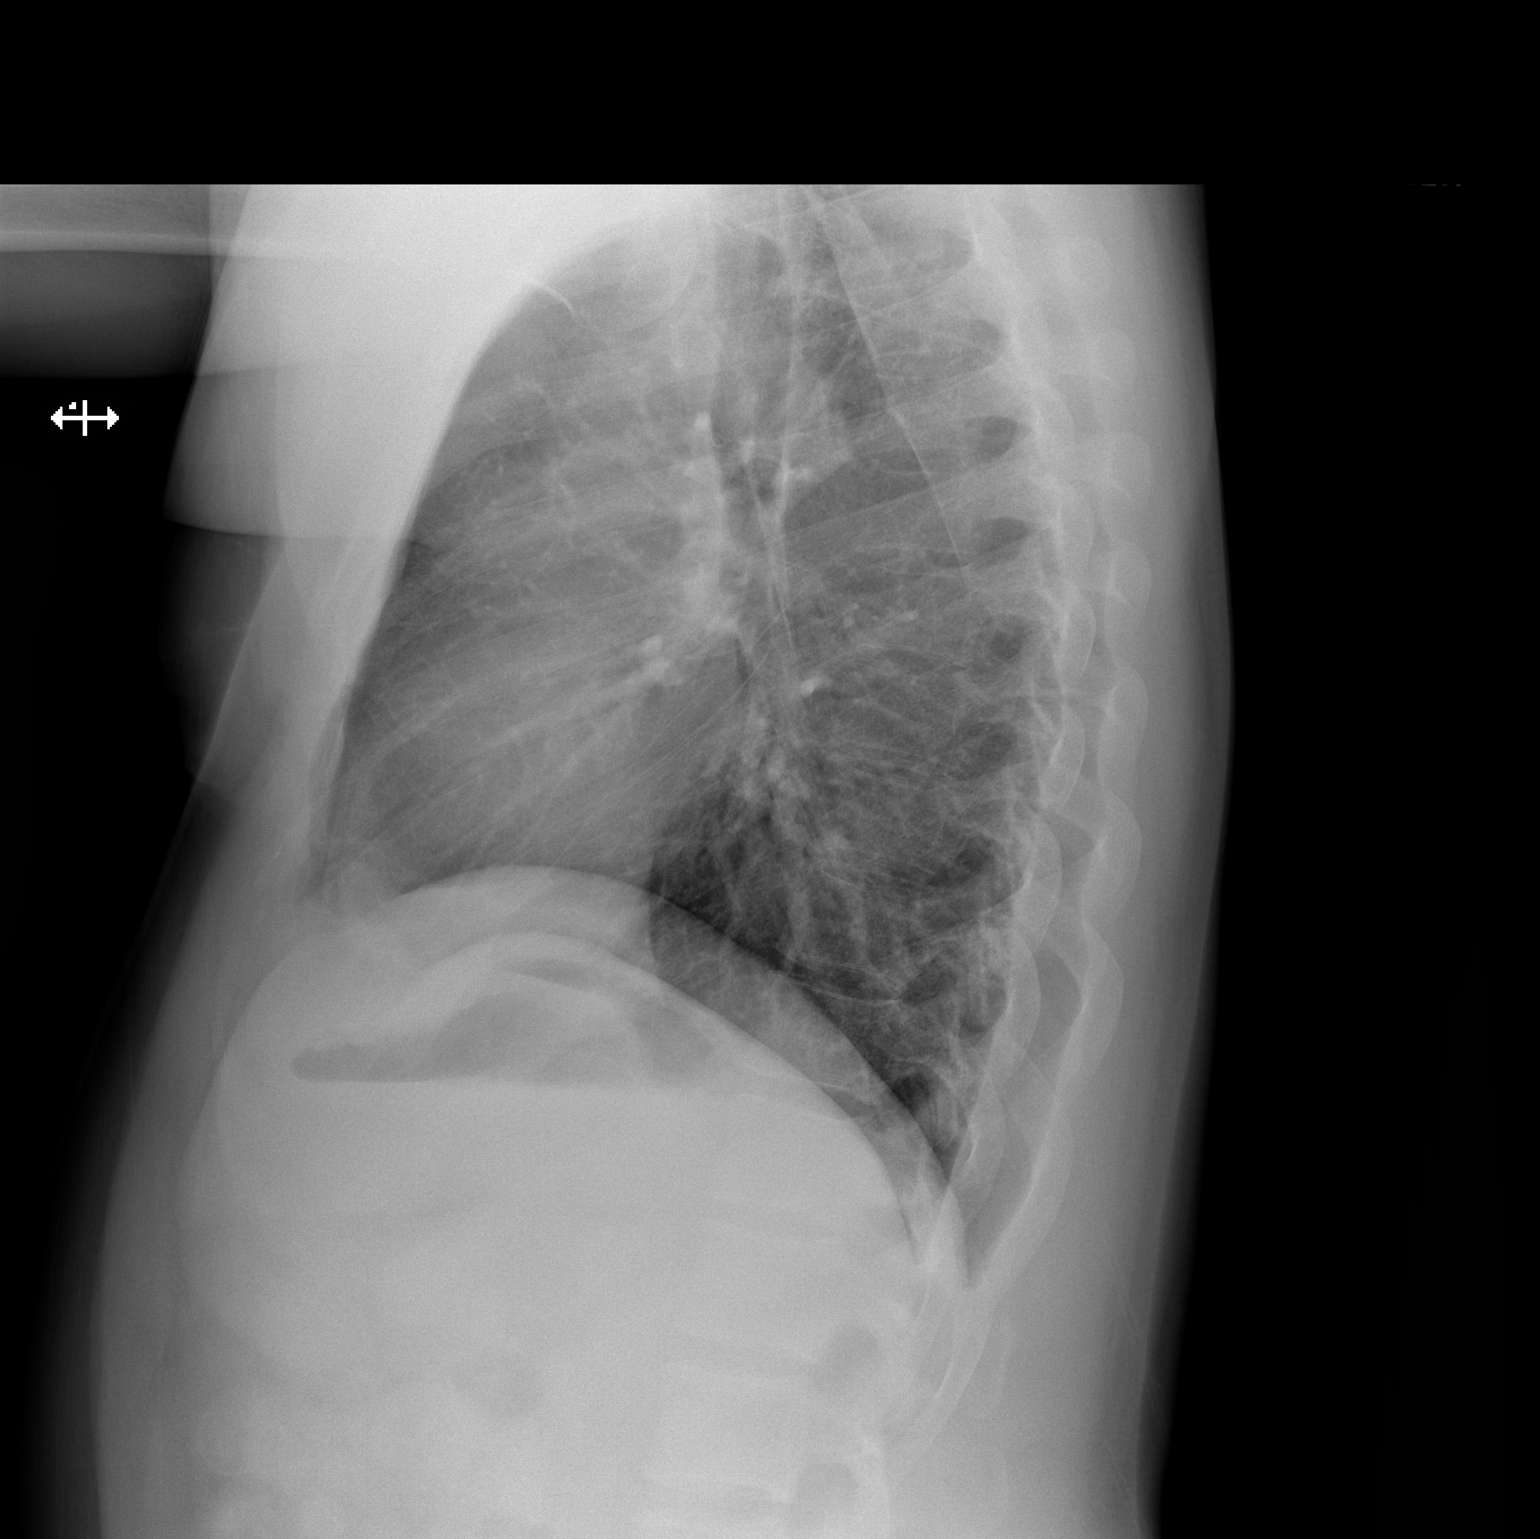

[2 of 2 positions shown; findings below may reference images not displayed]

FINDINGS: The heart size and mediastinal contours are within normal limits.
Both lungs are clear. The visualized skeletal structures are
unremarkable.
IMPRESSION: No active cardiopulmonary disease.

## 2019-09-18 ENCOUNTER — Other Ambulatory Visit: Payer: Self-pay | Admitting: Orthopedic Surgery

## 2019-09-21 ENCOUNTER — Other Ambulatory Visit: Payer: Self-pay

## 2019-09-21 ENCOUNTER — Encounter
Admission: RE | Admit: 2019-09-21 | Discharge: 2019-09-21 | Disposition: A | Discharge status: Home / Self Care | Payer: BC Managed Care – PPO | Source: Ambulatory Visit | Attending: Orthopedic Surgery | Admitting: Orthopedic Surgery

## 2019-09-21 DIAGNOSIS — Z01818 Encounter for other preprocedural examination: Secondary | ICD-10-CM | POA: Insufficient documentation

## 2019-09-21 NOTE — Patient Instructions (Signed)
Your procedure is scheduled on: 09-25-19 TUESDAY Report to Same Day Surgery 2nd floor medical mall [facility] Entrance-take elevator on left to 2nd floor.  Check in with surgery information desk.) To find out your arrival time please call [phone] between 1PM - 3PM on 09-24-19 MONDAY  Remember: Instructions that are not followed completely may result in serious medical risk, up to and including death, or upon the discretion of your surgeon and anesthesiologist your surgery may need to be rescheduled.    _x___ 1. Do not eat food after midnight the night before your procedure. NO GUM OR CANDY AFTER MIDNIGHT. You may drink clear liquids up to 2 hours before you are scheduled to arrive at the hospital for your procedure.  Do not drink clear liquids within 2 hours of your scheduled arrival to the hospital.  Clear liquids include  --Water or Apple juice without pulp  --Gatorade  --Black Coffee or Clear Tea (No milk, no creamers, do not add anything to the coffee or Tea   ____Ensure clear carbohydrate drink on the way to the hospital for bariatric patients  ____Ensure clear carbohydrate drink 3 hours before surgery.   __x__ 2. No Alcohol for 24 hours before or after surgery.   __x__3. No Smoking or e-cigarettes for 24 prior to surgery.  Do not use any chewable tobacco products for at least 6 hour prior to surgery   ____  4. Bring all medications with you on the day of surgery if instructed.    __x__ 5. Notify your doctor if there is any change in your medical condition     (cold, fever, infections).    x___6. On the morning of surgery brush your teeth with toothpaste and water.  You may rinse your mouth with mouth wash if you wish.  Do not swallow any toothpaste or mouthwash.   Do not wear jewelry, make-up, hairpins, clips or nail polish.  Do not wear lotions, powders, or perfumes.   Do not shave 48 hours prior to surgery. Men may shave face and neck.  Do not bring valuables to the  hospital.    [facility] is not responsible for any belongings or valuables.               Contacts, dentures or bridgework may not be worn into surgery.  Leave your suitcase in the car. After surgery it may be brought to your room.  For patients admitted to the hospital, discharge time is determined by your   treatment team.  _  Patients discharged the day of surgery will not be allowed to drive home.  You will need someone to drive you home and stay with you the night of your procedure.    Please read over the following fact sheets that you were given:   [facility] Preparing for Surgery   _x___ TAKE THE FOLLOWING MEDICATION THE MORNING OF SURGERY WITH A SMALL SIP OF WATER. These include:  1. GABAPENTIN (NEURONTIN)  2. SINGULAIR (FEXOFENADINE)  3.  4.  5.  6.  ____Fleets enema or Magnesium Citrate as directed.   _x___ Use CHG Soap or sage wipes as directed on instruction sheet   ____ Use inhalers on the day of surgery and bring to hospital day of surgery  ____ Stop Metformin and Janumet 2 days prior to surgery.    ____ Take 1/2 of usual insulin dose the night before surgery and none on the morning surgery.   ____ Follow recommendations from Cardiologist, Pulmonologist  or PCP regarding stopping Aspirin, Coumadin, Plavix ,Eliquis, Effient, or Pradaxa, and Pletal.  X____Stop Anti-inflammatories such as Advil, Aleve, Ibuprofen, Motrin, Naproxen, Naprosyn, Goodies powders or aspirin products NOW- OK to take Tylenol OR TRAMADOL IF NEEDED   ____ Stop supplements until after surgery.     ____ Bring C-Pap to the hospital.

## 2019-09-24 ENCOUNTER — Encounter
Admission: RE | Admit: 2019-09-24 | Discharge: 2019-09-24 | Disposition: A | Discharge status: Home / Self Care | Payer: BC Managed Care – PPO | Source: Ambulatory Visit | Attending: Orthopedic Surgery | Admitting: Orthopedic Surgery

## 2019-09-24 ENCOUNTER — Other Ambulatory Visit: Payer: Self-pay

## 2019-09-24 ENCOUNTER — Other Ambulatory Visit: Admission: RE | Admit: 2019-09-24 | Payer: BC Managed Care – PPO | Source: Ambulatory Visit

## 2019-09-24 DIAGNOSIS — Z01818 Encounter for other preprocedural examination: Secondary | ICD-10-CM | POA: Insufficient documentation

## 2019-09-24 DIAGNOSIS — Z20822 Contact with and (suspected) exposure to covid-19: Secondary | ICD-10-CM | POA: Diagnosis not present

## 2019-09-24 DIAGNOSIS — I10 Essential (primary) hypertension: Secondary | ICD-10-CM | POA: Diagnosis not present

## 2019-09-24 DIAGNOSIS — R9431 Abnormal electrocardiogram [ECG] [EKG]: Secondary | ICD-10-CM | POA: Diagnosis not present

## 2019-09-24 DIAGNOSIS — I498 Other specified cardiac arrhythmias: Secondary | ICD-10-CM | POA: Insufficient documentation

## 2019-09-24 LAB — CBC WITH DIFFERENTIAL/PLATELET
Abs Immature Granulocytes: 0.06 10*3/uL (ref 0.00–0.07)
Basophils Absolute: 0 10*3/uL (ref 0.0–0.1)
Basophils Relative: 1 %
Eosinophils Absolute: 0.1 10*3/uL (ref 0.0–0.5)
Eosinophils Relative: 2 %
HCT: 45.7 % (ref 39.0–52.0)
Hemoglobin: 15.3 g/dL (ref 13.0–17.0)
Immature Granulocytes: 1 %
Lymphocytes Relative: 30 %
Lymphs Abs: 2.6 10*3/uL (ref 0.7–4.0)
MCH: 29.6 pg (ref 26.0–34.0)
MCHC: 33.5 g/dL (ref 30.0–36.0)
MCV: 88.4 fL (ref 80.0–100.0)
Monocytes Absolute: 0.7 10*3/uL (ref 0.1–1.0)
Monocytes Relative: 8 %
Neutro Abs: 5.2 10*3/uL (ref 1.7–7.7)
Neutrophils Relative %: 58 %
Platelets: 373 10*3/uL (ref 150–400)
RBC: 5.17 MIL/uL (ref 4.22–5.81)
RDW: 12.7 % (ref 11.5–15.5)
WBC: 8.8 10*3/uL (ref 4.0–10.5)
nRBC: 0 % (ref 0.0–0.2)

## 2019-09-24 LAB — SARS CORONAVIRUS 2 (TAT 6-24 HRS): SARS Coronavirus 2: NEGATIVE

## 2019-09-24 LAB — PROTIME-INR
INR: 1 (ref 0.8–1.2)
Prothrombin Time: 12.6 seconds (ref 11.4–15.2)

## 2019-09-24 LAB — BASIC METABOLIC PANEL
Anion gap: 9 (ref 5–15)
BUN: 11 mg/dL (ref 6–20)
CO2: 24 mmol/L (ref 22–32)
Calcium: 9.1 mg/dL (ref 8.9–10.3)
Chloride: 104 mmol/L (ref 98–111)
Creatinine, Ser: 1.03 mg/dL (ref 0.61–1.24)
GFR calc Af Amer: >60 mL/min (ref >60)
GFR calc non Af Amer: >60 mL/min (ref >60)
Glucose, Bld: 125 mg/dL — ABNORMAL HIGH (ref 70–99)
Potassium: 3.8 mmol/L (ref 3.5–5.1)
Sodium: 137 mmol/L (ref 135–145)

## 2019-09-24 LAB — APTT: aPTT: 27 seconds (ref 24–36)

## 2019-09-25 ENCOUNTER — Ambulatory Visit
Admission: RE | Admit: 2019-09-25 | Payer: No Typology Code available for payment source | Source: Home / Self Care | Admitting: Orthopedic Surgery

## 2019-09-25 ENCOUNTER — Encounter: Admission: RE | Payer: Self-pay | Source: Home / Self Care

## 2019-09-25 SURGERY — SHOULDER ARTHROSCOPY WITH BICEPS TENOTOMY
Anesthesia: General | Site: Shoulder | Laterality: Right

## 2019-09-26 ENCOUNTER — Encounter: Payer: Self-pay | Admitting: Orthopedic Surgery

## 2019-09-27 ENCOUNTER — Other Ambulatory Visit: Payer: Self-pay

## 2019-09-27 ENCOUNTER — Ambulatory Visit: Payer: No Typology Code available for payment source | Admitting: Certified Registered Nurse Anesthetist

## 2019-09-27 ENCOUNTER — Encounter
Admission: RE | Disposition: A | Discharge status: Home / Self Care | Payer: Self-pay | Source: Home / Self Care | Attending: Orthopedic Surgery

## 2019-09-27 ENCOUNTER — Ambulatory Visit
Admission: RE | Admit: 2019-09-27 | Discharge: 2019-09-27 | Disposition: A | Discharge status: Home / Self Care | Payer: No Typology Code available for payment source | Attending: Orthopedic Surgery | Admitting: Orthopedic Surgery

## 2019-09-27 DIAGNOSIS — Z9889 Other specified postprocedural states: Secondary | ICD-10-CM | POA: Diagnosis not present

## 2019-09-27 DIAGNOSIS — M67813 Other specified disorders of tendon, right shoulder: Secondary | ICD-10-CM | POA: Insufficient documentation

## 2019-09-27 HISTORY — PX: SHOULDER ARTHROSCOPY WITH BICEPSTENOTOMY: SHX6204

## 2019-09-27 SURGERY — SHOULDER ARTHROSCOPY WITH BICEPS TENOTOMY
Anesthesia: General | Site: Shoulder | Laterality: Right

## 2019-09-27 MED ORDER — FENTANYL CITRATE (PF) 100 MCG/2ML IJ SOLN
INTRAMUSCULAR | Status: AC
  Filled 2019-09-27: qty 2

## 2019-09-27 MED ORDER — LIDOCAINE HCL (PF) 2 % IJ SOLN
INTRAMUSCULAR | Status: AC
  Filled 2019-09-27: qty 20

## 2019-09-27 MED ORDER — ROCURONIUM BROMIDE 10 MG/ML (PF) SYRINGE
PREFILLED_SYRINGE | INTRAVENOUS | Status: AC
  Filled 2019-09-27: qty 30

## 2019-09-27 MED ORDER — FENTANYL CITRATE (PF) 100 MCG/2ML IJ SOLN
25.0000 ug | INTRAMUSCULAR | Status: DC | PRN
  Administered 2019-09-27: 25 ug via INTRAVENOUS

## 2019-09-27 MED ORDER — BUPIVACAINE HCL (PF) 0.25 % IJ SOLN
INTRAMUSCULAR | Status: AC
  Filled 2019-09-27: qty 30

## 2019-09-27 MED ORDER — FAMOTIDINE 20 MG PO TABS: 20.0000 mg | ORAL_TABLET | Freq: Once | ORAL | Status: AC

## 2019-09-27 MED ORDER — LIDOCAINE HCL (PF) 1 % IJ SOLN
INTRAMUSCULAR | Status: AC
  Filled 2019-09-27: qty 30

## 2019-09-27 MED ORDER — OXYCODONE HCL 5 MG PO TABS
5.0000 mg | ORAL_TABLET | Freq: Once | ORAL | Status: AC | PRN
  Administered 2019-09-27: 5 mg via ORAL

## 2019-09-27 MED ORDER — MIDAZOLAM HCL 2 MG/2ML IJ SOLN
INTRAMUSCULAR | Status: AC
  Filled 2019-09-27: qty 2

## 2019-09-27 MED ORDER — ONDANSETRON HCL 4 MG/2ML IJ SOLN: 4.0000 mg | Freq: Once | INTRAMUSCULAR | Status: DC | PRN

## 2019-09-27 MED ORDER — CHLORHEXIDINE GLUCONATE CLOTH 2 % EX PADS
6.0000 | MEDICATED_PAD | Freq: Once | CUTANEOUS | Status: AC
  Administered 2019-09-27: 6 via TOPICAL

## 2019-09-27 MED ORDER — LIDOCAINE HCL (PF) 1 % IJ SOLN
INTRAMUSCULAR | Status: DC | PRN
  Administered 2019-09-27: 3 mL

## 2019-09-27 MED ORDER — CEFAZOLIN SODIUM-DEXTROSE 2-4 GM/100ML-% IV SOLN: 2.0000 g | INTRAVENOUS | Status: DC

## 2019-09-27 MED ORDER — CHLORHEXIDINE GLUCONATE 0.12 % MT SOLN
OROMUCOSAL | Status: AC
  Administered 2019-09-27: 15 mL via OROMUCOSAL
  Filled 2019-09-27: qty 15

## 2019-09-27 MED ORDER — DEXAMETHASONE SODIUM PHOSPHATE 10 MG/ML IJ SOLN
INTRAMUSCULAR | Status: AC
  Filled 2019-09-27: qty 2

## 2019-09-27 MED ORDER — CHLORHEXIDINE GLUCONATE 0.12 % MT SOLN: 15.0000 mL | Freq: Once | OROMUCOSAL | Status: AC

## 2019-09-27 MED ORDER — OXYCODONE HCL 5 MG/5ML PO SOLN: 5.0000 mg | Freq: Once | ORAL | Status: AC | PRN

## 2019-09-27 MED ORDER — PROPOFOL 10 MG/ML IV BOLUS
INTRAVENOUS | Status: AC
  Filled 2019-09-27: qty 20

## 2019-09-27 MED ORDER — BUPIVACAINE HCL (PF) 0.25 % IJ SOLN
INTRAMUSCULAR | Status: DC | PRN
  Administered 2019-09-27: 30 mL

## 2019-09-27 MED ORDER — CEFAZOLIN SODIUM-DEXTROSE 2-4 GM/100ML-% IV SOLN
INTRAVENOUS | Status: AC
  Filled 2019-09-27: qty 100

## 2019-09-27 MED ORDER — ONDANSETRON HCL 4 MG/2ML IJ SOLN
INTRAMUSCULAR | Status: AC
  Filled 2019-09-27: qty 4

## 2019-09-27 MED ORDER — ORAL CARE MOUTH RINSE: 15.0000 mL | Freq: Once | OROMUCOSAL | Status: AC

## 2019-09-27 MED ORDER — OXYCODONE HCL 5 MG PO TABS
ORAL_TABLET | ORAL | Status: AC
  Filled 2019-09-27: qty 1

## 2019-09-27 MED ORDER — LACTATED RINGERS IV SOLN
INTRAVENOUS | Status: DC | PRN
  Administered 2019-09-27: 4 mL

## 2019-09-27 MED ORDER — EPHEDRINE 5 MG/ML INJ
INTRAVENOUS | Status: AC
  Filled 2019-09-27: qty 10

## 2019-09-27 MED ORDER — LACTATED RINGERS IV SOLN: INTRAVENOUS | Status: DC

## 2019-09-27 MED ORDER — FAMOTIDINE 20 MG PO TABS
ORAL_TABLET | ORAL | Status: AC
  Administered 2019-09-27: 20 mg via ORAL
  Filled 2019-09-27: qty 1

## 2019-09-27 SURGICAL SUPPLY — 62 items
ADAPTER IRRIG TUBE 2 SPIKE SOL (ADAPTER) ×4 IMPLANT
ADPR TBG 2 SPK PMP STRL ASCP (ADAPTER) ×2
ANCH SUT SHRT 12.5 CANN EYLT (Anchor) ×1 IMPLANT
ANCHOR SUT BIOCOMP LK 2.9X12.5 (Anchor) ×2 IMPLANT
BUR RADIUS 4.0X18.5 (BURR) IMPLANT
BUR RADIUS 5.5 (BURR) ×1 IMPLANT
CANNULA 5.75X7 CRYSTAL CLEAR (CANNULA) ×3 IMPLANT
CANNULA PARTIAL THREAD 2X7 (CANNULA) ×2 IMPLANT
CANNULA TWIST IN 8.25X9CM (CANNULA) IMPLANT
COOLER POLAR GLACIER W/PUMP (MISCELLANEOUS) ×1 IMPLANT
COVER WAND RF STERILE (DRAPES) ×2 IMPLANT
CRADLE LAMINECT ARM (MISCELLANEOUS) ×4 IMPLANT
DEVICE SUCT BLK HOLE OR FLOOR (MISCELLANEOUS) IMPLANT
DRAPE 3/4 80X56 (DRAPES) ×1 IMPLANT
DRAPE IMP U-DRAPE 54X76 (DRAPES) ×4 IMPLANT
DRAPE INCISE IOBAN 66X45 STRL (DRAPES) ×2 IMPLANT
DRAPE SPLIT 6X30 W/TAPE (DRAPES) ×4 IMPLANT
DRAPE U-SHAPE 47X51 STRL (DRAPES) IMPLANT
DRSG OPSITE POSTOP 3X4 (GAUZE/BANDAGES/DRESSINGS) ×4 IMPLANT
DURAPREP 26ML APPLICATOR (WOUND CARE) ×6 IMPLANT
ELECT REM PT RETURN 9FT ADLT (ELECTROSURGICAL)
ELECTRODE REM PT RTRN 9FT ADLT (ELECTROSURGICAL) IMPLANT
GAUZE SPONGE 4X4 12PLY STRL (GAUZE/BANDAGES/DRESSINGS) IMPLANT
GAUZE XEROFORM 1X8 LF (GAUZE/BANDAGES/DRESSINGS) ×2 IMPLANT
GLOVE BIOGEL PI IND STRL 9 (GLOVE) ×1 IMPLANT
GLOVE BIOGEL PI INDICATOR 9 (GLOVE) ×1
GLOVE SURG 9.0 ORTHO LTXF (GLOVE) ×4 IMPLANT
GOWN STRL REUS TWL 2XL XL LVL4 (GOWN DISPOSABLE) ×2 IMPLANT
IV LACTATED RINGER IRRG 3000ML (IV SOLUTION) ×12
IV LR IRRIG 3000ML ARTHROMATIC (IV SOLUTION) ×6 IMPLANT
KIT INSERTION 2.9 PUSHLOCK (KITS) ×2 IMPLANT
KIT STABILIZATION SHOULDER (MISCELLANEOUS) ×2 IMPLANT
KIT TURNOVER KIT A (KITS) ×2 IMPLANT
MANIFOLD NEPTUNE II (INSTRUMENTS) ×2 IMPLANT
MASK FACE SPIDER DISP (MASK) ×2 IMPLANT
MAT ABSORB  FLUID 56X50 GRAY (MISCELLANEOUS) ×4
MAT ABSORB FLUID 56X50 GRAY (MISCELLANEOUS) ×2 IMPLANT
NEEDLE HYPO 22GX1.5 SAFETY (NEEDLE) ×2 IMPLANT
PACK SHDR ARTHRO (MISCELLANEOUS) ×2 IMPLANT
PAD WRAPON POLAR SHDR XLG (MISCELLANEOUS) IMPLANT
SET TUBE SUCT SHAVER OUTFL 24K (TUBING) ×2 IMPLANT
SET TUBE TIP INTRA-ARTICULAR (MISCELLANEOUS) ×2 IMPLANT
SLEEVE PROTECTION STRL DISP (MISCELLANEOUS) ×2 IMPLANT
SLING ARM LRG DEEP (SOFTGOODS) ×2 IMPLANT
STRAP SAFETY 5IN WIDE (MISCELLANEOUS) ×2 IMPLANT
STRIP CLOSURE SKIN 1/2X4 (GAUZE/BANDAGES/DRESSINGS) ×2 IMPLANT
SUT ETHILON 4-0 (SUTURE) ×2
SUT ETHILON 4-0 FS2 18XMFL BLK (SUTURE) ×1
SUT MNCRL 4-0 (SUTURE) ×2
SUT MNCRL 4-0 27XMFL (SUTURE) ×1
SUT PDS AB 0 CT1 27 (SUTURE) IMPLANT
SUT VIC AB 0 CT1 36 (SUTURE) IMPLANT
SUT VIC AB 2-0 CT2 27 (SUTURE) IMPLANT
SUTURE ETHLN 4-0 FS2 18XMF BLK (SUTURE) ×1 IMPLANT
SUTURE MNCRL 4-0 27XMF (SUTURE) ×1 IMPLANT
SUTURE TAPE FIBERLINK 1.3 LOOP (SUTURE) ×1 IMPLANT
SUTURETAPE FIBERLINK 1.3 LOOP (SUTURE) ×2
TAPE MICROFOAM 4IN (TAPE) IMPLANT
TUBING ARTHRO INFLOW-ONLY STRL (TUBING) ×2 IMPLANT
TUBING CONNECTING 10 (TUBING) ×2 IMPLANT
WAND HAND CNTRL MULTIVAC 90 (MISCELLANEOUS) ×2 IMPLANT
WRAPON POLAR PAD SHDR XLG (MISCELLANEOUS)

## 2019-09-27 NOTE — Discharge Instructions (Signed)
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
## 2019-09-27 NOTE — Transfer of Care (Signed)
Immediate Anesthesia Transfer of Care Note  Patient: Jeffrey Thomas  Procedure(s) Performed: RIGHT SHOULDER ARTHROSCOPY WITH BICEPS TENODESIS (Right Shoulder)  Patient Location: PACU  Anesthesia Type:General  Level of Consciousness: sedated  Airway & Oxygen Therapy: Patient Spontanous Breathing and Patient connected to face mask oxygen  Post-op Assessment: Report given to RN and Post -op Vital signs reviewed and stable  Post vital signs: Reviewed and stable  Last Vitals:  Vitals Value Taken Time  BP 112/68 09/27/19 1415  Temp    Pulse 74 09/27/19 1415  Resp 13 09/27/19 1415  SpO2 100 % 09/27/19 1415    Last Pain:  Vitals:   09/27/19 1415  TempSrc:   PainSc: 0-No pain         Complications: No apparent anesthesia complications

## 2019-09-27 NOTE — Op Note (Signed)
09/27/2019  2:41 PM  PATIENT:  Jeffrey Thomas  39 y.o. male  PRE-OPERATIVE DIAGNOSIS:  Tendinosis of Right Biceps Brachii  POST-OPERATIVE DIAGNOSIS:  TENDINOSIS OF RIGHT BICEPS BRACHII  PROCEDURE:  Procedure(s): RIGHT SHOULDER ARTHROSCOPIC BICEPS TENODESIS   SURGEON:  Surgeon(s) and Role:    Thornton Park, MD - Primary  ANESTHESIA:   local and general   PREOPERATIVE INDICATIONS:  Jeffrey Thomas is a  39 y.o. male with a diagnosis of Tendinosis of Right Biceps Brachii who failed conservative measures and elected for surgical management.    The risks benefits and alternatives were discussed with the patient preoperatively including but not limited to the risks of infection, bleeding, nerve injury, persistent pain or weakness, failure of the hardware, re-tear of the rotator cuff and the need for further surgery. Medical risks include DVT and pulmonary embolism, myocardial infarction, stroke, pneumonia, respiratory failure and death. Patient understood these risks and wished to proceed.  OPERATIVE IMPLANTS: Arthrex Loop n' Tack Biceps tenodesis set with a single Arthrex Pushlock anchor  OPERATIVE PROCEDURE: The patient was met in the preoperative area.  A preop H&P was performed at the bedside.  The right shoulder was signed with the word yes and my initials according the hospital's correct site of surgery protocol. The patient is brought to the OR and underwent general endotracheal intubation by the anesthesia service.  The patient was placed in a beachchair position. A spider arm positioner was used for this case. Examination under anesthesia revealed full passive range of motion and no instability to load-and-shift testing.  Patient had a negative sulcus sign..  The patient was prepped and draped in a sterile fashion. A timeout was performed to verify the patient's name, date of birth, medical record number, correct site of surgery and correct procedure to be performed  there was also used to verify the patient received antibiotics that all appropriate instruments, implants and radiographs studies were available in the room. Once all in attendance were in agreement case began.  Patient received 2 g of Ancef prior to the onset of the case.  Bony landmarks were drawn out with a surgical marker along with proposed arthroscopy incisions. These were pre-injected with 1% lidocaine plain. An 11 blade was used to establish a posterior portal through which the arthroscope was placed in the glenohumeral joint. A full diagnostic examination of the shoulder was performed. The anterior portal was established under direct visualization with an 18-gauge spinal needle.  A 5.75 mm arthroscopic cannula was placed through the anterior portal.   The previous SLAP and rotator cuff repairs were found to be intact.  There is no other labral tears seen.  There were no focal chondral lesions of the glenohumeral joint.  The subscapularis was intact.  Patient had no loose bodies or HAGL lesion in the inferior recess.  Patient's biceps tendon was intact but found to have tendinosis and hyperemia.  Therefore the decision was made to perform an arthroscopic biceps tenodesis.  The Arthrex loop n' tack arthroscopic biceps tenodesis kit was used for this case.  Through the anterior portal suture was passed around the biceps tendon in a "luggage tag" fashion.  The end of the luggage tag suture was then placed back into the shoulder joint.  A BirdBeak grasper was then used to penetrate the mid substance of the biceps tendon just distal to the luggage tag fixation.  The free end of the suture was then grasped and brought through the tendon and out  the anterior portal.  An arthroscopic scissor was then used through the anterior portal to release the biceps from its superior labral attachment.  A drill sleeve was then placed at the distal extent of this superior aspect of the subscapularis tendon at the top of  the anterior tuberous groove.  A drill hole for a push lock anchor was made.  A nitinol wire was placed into this hole for later identification.  The luggage tag suture was then placed through a push lock anchor.  This was shuttled through the anterior portal until it engaged the drill hole.  The Nitinol the wire was removed.  The suture was tensioned to bring the biceps tendon into proximity to the drill hole.  The push lock anchor was then malleted into place to complete the tenodesis.  The 90 degree ArthroCare wand was then used to the anterior portal to "mushroom" in the cut end of the biceps to prevent loss of fixation with a luggage tag suture..    The arthroscope was then placed in the subacromial space.  No significant bursitis was encountered.  The previous rotator cuff tear appeared well-healed.  The arthroscope was then removed from the subacromial space after taking arthroscopic photos.  Skin closure for the arthroscopic incisions was performed with 4-0 nylon.  0.25%  Marcaine plain was injected into the subacromial space and at the injection sites.  A dry sterile dressing including Steri-Strips was applied . The patient was placed in a sling.  All sharp, sponge and instrument counts were correct at the conclusion of the case. I was scrubbed and present for the entire case. I spoke with the patient's mother in the post-op consultation room and informed her that the case had been performed without complication and the patient was stable in recovery room.     Timoteo Gaul, MD

## 2019-09-28 NOTE — Anesthesia Postprocedure Evaluation (Signed)
Anesthesia Post Note  Patient: Jeffrey Thomas  Procedure(s) Performed: RIGHT SHOULDER ARTHROSCOPY WITH BICEPS TENODESIS (Right Shoulder)  Patient location during evaluation: PACU Anesthesia Type: General Level of consciousness: awake and alert Pain management: pain level controlled Vital Signs Assessment: post-procedure vital signs reviewed and stable Respiratory status: spontaneous breathing, nonlabored ventilation and respiratory function stable Cardiovascular status: blood pressure returned to baseline and stable Postop Assessment: no apparent nausea or vomiting Anesthetic complications: no     Last Vitals:  Vitals:   09/27/19 1527 09/27/19 1556  BP: 126/84 117/79  Pulse: 79 75  Resp: 16 16  Temp: (!) 36.1 C (!) 36.2 C  SpO2: 100% 100%    Last Pain:  Vitals:   09/27/19 1556  TempSrc: Temporal  PainSc: 6                  Karleen Hampshire

## 2021-05-02 ENCOUNTER — Emergency Department: Payer: Managed Care, Other (non HMO)

## 2021-05-02 ENCOUNTER — Encounter: Payer: Self-pay | Admitting: Emergency Medicine

## 2021-05-02 ENCOUNTER — Emergency Department
Admission: EM | Admit: 2021-05-02 | Discharge: 2021-05-02 | Disposition: A | Discharge status: Home / Self Care | Payer: Managed Care, Other (non HMO) | Attending: Emergency Medicine | Admitting: Emergency Medicine

## 2021-05-02 ENCOUNTER — Other Ambulatory Visit: Payer: Self-pay

## 2021-05-02 DIAGNOSIS — R1032 Left lower quadrant pain: Secondary | ICD-10-CM | POA: Diagnosis present

## 2021-05-02 DIAGNOSIS — K5900 Constipation, unspecified: Secondary | ICD-10-CM | POA: Insufficient documentation

## 2021-05-02 DIAGNOSIS — I10 Essential (primary) hypertension: Secondary | ICD-10-CM | POA: Insufficient documentation

## 2021-05-02 DIAGNOSIS — Z79899 Other long term (current) drug therapy: Secondary | ICD-10-CM | POA: Insufficient documentation

## 2021-05-02 LAB — COMPREHENSIVE METABOLIC PANEL
ALT: 27 U/L (ref 0–44)
AST: 23 U/L (ref 15–41)
Albumin: 4.2 g/dL (ref 3.5–5.0)
Alkaline Phosphatase: 52 U/L (ref 38–126)
Anion gap: 9 (ref 5–15)
BUN: 18 mg/dL (ref 6–20)
CO2: 25 mmol/L (ref 22–32)
Calcium: 9.4 mg/dL (ref 8.9–10.3)
Chloride: 104 mmol/L (ref 98–111)
Creatinine, Ser: 1.18 mg/dL (ref 0.61–1.24)
GFR, Estimated: >60 mL/min (ref >60)
Glucose, Bld: 101 mg/dL — ABNORMAL HIGH (ref 70–99)
Potassium: 3.8 mmol/L (ref 3.5–5.1)
Sodium: 138 mmol/L (ref 135–145)
Total Bilirubin: 0.7 mg/dL (ref 0.3–1.2)
Total Protein: 8.2 g/dL — ABNORMAL HIGH (ref 6.5–8.1)

## 2021-05-02 LAB — URINALYSIS, ROUTINE W REFLEX MICROSCOPIC
Bilirubin Urine: NEGATIVE
Glucose, UA: NEGATIVE mg/dL
Ketones, ur: NEGATIVE mg/dL
Leukocytes,Ua: NEGATIVE
Nitrite: NEGATIVE
Protein, ur: NEGATIVE mg/dL
Specific Gravity, Urine: 1.028 (ref 1.005–1.030)
pH: 5 (ref 5.0–8.0)

## 2021-05-02 LAB — CBC
HCT: 44.2 % (ref 39.0–52.0)
Hemoglobin: 14.7 g/dL (ref 13.0–17.0)
MCH: 28.9 pg (ref 26.0–34.0)
MCHC: 33.3 g/dL (ref 30.0–36.0)
MCV: 87 fL (ref 80.0–100.0)
Platelets: 328 10*3/uL (ref 150–400)
RBC: 5.08 MIL/uL (ref 4.22–5.81)
RDW: 12.5 % (ref 11.5–15.5)
WBC: 8.3 10*3/uL (ref 4.0–10.5)
nRBC: 0 % (ref 0.0–0.2)

## 2021-05-02 LAB — LIPASE, BLOOD: Lipase: 33 U/L (ref 11–51)
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
## 2021-05-02 NOTE — ED Triage Notes (Signed)
Pt via POV from home. Pt c/o lower abd pain. Pt states that its been going the past 4-5 days. Denies NVD. Denies fever. Pt is A&OX4 and NAD. Denies any abd surgeries.

## 2022-04-02 ENCOUNTER — Emergency Department
Admission: EM | Admit: 2022-04-02 | Discharge: 2022-04-02 | Discharge status: Absent without leave | Payer: Managed Care, Other (non HMO) | Attending: Physician Assistant | Admitting: Physician Assistant

## 2022-04-02 ENCOUNTER — Other Ambulatory Visit: Payer: Self-pay | Admitting: Physician Assistant

## 2022-04-02 ENCOUNTER — Ambulatory Visit
Admission: EM | Admit: 2022-04-02 | Discharge: 2022-04-02 | Disposition: A | Discharge status: Absent without leave | Payer: Managed Care, Other (non HMO) | Source: Ambulatory Visit | Attending: Physician Assistant | Admitting: Physician Assistant

## 2022-04-02 DIAGNOSIS — R600 Localized edema: Secondary | ICD-10-CM

## 2023-06-04 ENCOUNTER — Ambulatory Visit
Admission: EM | Admit: 2023-06-04 | Discharge: 2023-06-04 | Disposition: A | Discharge status: Home / Self Care | Payer: Managed Care, Other (non HMO) | Attending: Nurse Practitioner | Admitting: Nurse Practitioner

## 2023-06-04 DIAGNOSIS — J069 Acute upper respiratory infection, unspecified: Secondary | ICD-10-CM | POA: Diagnosis not present

## 2023-06-04 DIAGNOSIS — R059 Cough, unspecified: Secondary | ICD-10-CM | POA: Insufficient documentation

## 2023-06-04 DIAGNOSIS — B9789 Other viral agents as the cause of diseases classified elsewhere: Secondary | ICD-10-CM | POA: Diagnosis not present

## 2023-06-04 LAB — POCT RAPID STREP A (OFFICE): Rapid Strep A Screen: NEGATIVE

## 2023-06-04 LAB — POCT INFLUENZA A/B
Influenza A, POC: NEGATIVE
Influenza B, POC: NEGATIVE

## 2023-06-04 MED ORDER — FLUTICASONE PROPIONATE 50 MCG/ACT NA SUSP: 2.0000 | Freq: Every day | NASAL | 0 refills | Status: AC

## 2023-06-04 MED ORDER — CETIRIZINE HCL 10 MG PO TABS: 10.0000 mg | ORAL_TABLET | Freq: Every day | ORAL | 0 refills | Status: AC

## 2023-06-04 MED ORDER — PROMETHAZINE-DM 6.25-15 MG/5ML PO SYRP: 5.0000 mL | ORAL_SOLUTION | Freq: Four times a day (QID) | ORAL | 0 refills | Status: AC | PRN

## 2023-06-04 NOTE — Discharge Instructions (Addendum)
The rapid strep test and influenza test were negative.  Throat culture is pending.  You will be contacted if the pending test results are abnormal. Take medication as prescribed. Increase fluids and allow for plenty of rest. May take over-the-counter Tylenol or ibuprofen as needed for pain, fever, or general discomfort. Warm salt water gargles 3-4 times daily as needed for throat pain or discomfort. Recommend a soft diet to include soup, broth, yogurt, pudding, or Jell-O. For your cough, recommend using a humidifier in your bedroom at nighttime during sleep. You may also use normal saline nasal spray throughout the day for nasal congestion. If symptoms do not improve over the next 5 to 7 days, or if there appear to be worsening, you may follow-up in this clinic or with your primary care physician for further evaluation. Follow-up as needed.
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
## 2023-06-04 NOTE — ED Triage Notes (Addendum)
Pt reports he has a sore throat, body aches, chills,  nasal congestion cough x 2 days  Took home covid test was neg

## 2023-06-07 LAB — CULTURE, GROUP A STREP (THRC)

## 2024-04-18 ENCOUNTER — Other Ambulatory Visit: Payer: Self-pay
